# Patient Record
Sex: Female | Born: 1980 | Hispanic: No | Marital: Married | Smoking: Never smoker
Health system: Southern US, Community
[De-identification: ages and names within clinical notes are randomized; demographics above are authoritative.]

## PROBLEM LIST (undated history)

## (undated) DIAGNOSIS — K859 Acute pancreatitis without necrosis or infection, unspecified: Secondary | ICD-10-CM

## (undated) DIAGNOSIS — K8020 Calculus of gallbladder without cholecystitis without obstruction: Secondary | ICD-10-CM

---

## 2010-01-16 ENCOUNTER — Emergency Department (HOSPITAL_COMMUNITY): Admission: EM | Admit: 2010-01-16 | Discharge: 2010-01-17 | Payer: Self-pay | Admitting: Emergency Medicine

## 2010-02-12 ENCOUNTER — Emergency Department (HOSPITAL_COMMUNITY): Admission: EM | Admit: 2010-02-12 | Discharge: 2010-02-12 | Payer: Self-pay | Admitting: Emergency Medicine

## 2010-09-21 LAB — POCT URINALYSIS DIPSTICK
Glucose, UA: NEGATIVE mg/dL
Nitrite: NEGATIVE
Protein, ur: NEGATIVE mg/dL
Specific Gravity, Urine: 1.025 (ref 1.005–1.030)
Urobilinogen, UA: 0.2 mg/dL (ref 0.0–1.0)
pH: 5.5 (ref 5.0–8.0)

## 2010-09-21 LAB — URINE CULTURE
Colony Count: NO GROWTH
Culture: NO GROWTH

## 2010-09-23 LAB — CBC
HCT: 38.4 % (ref 36.0–46.0)
MCH: 29.9 pg (ref 26.0–34.0)
MCV: 88.6 fL (ref 78.0–100.0)
Platelets: 168 10*3/uL (ref 150–400)
RDW: 13 % (ref 11.5–15.5)

## 2010-09-23 LAB — WET PREP, GENITAL: Yeast Wet Prep HPF POC: NONE SEEN

## 2010-09-23 LAB — POCT PREGNANCY, URINE: Preg Test, Ur: NEGATIVE

## 2010-09-23 LAB — URINALYSIS, ROUTINE W REFLEX MICROSCOPIC
Bilirubin Urine: NEGATIVE
Glucose, UA: NEGATIVE mg/dL
Hgb urine dipstick: NEGATIVE
Ketones, ur: NEGATIVE mg/dL
Nitrite: NEGATIVE
Protein, ur: NEGATIVE mg/dL
pH: 5.5 (ref 5.0–8.0)

## 2010-09-23 LAB — BASIC METABOLIC PANEL
BUN: 9 mg/dL (ref 6–23)
Creatinine, Ser: 0.6 mg/dL (ref 0.4–1.2)
GFR calc Af Amer: >60 mL/min (ref >60)
Potassium: 3.7 mEq/L (ref 3.5–5.1)

## 2010-09-23 LAB — GC/CHLAMYDIA PROBE AMP, GENITAL: GC Probe Amp, Genital: NEGATIVE

## 2010-09-23 LAB — URINE MICROSCOPIC-ADD ON

## 2010-09-23 LAB — DIFFERENTIAL
Basophils Absolute: 0.1 10*3/uL (ref 0.0–0.1)
Basophils Relative: 1 % (ref 0–1)
Eosinophils Relative: 2 % (ref 0–5)
Lymphocytes Relative: 32 % (ref 12–46)
Neutro Abs: 6.1 10*3/uL (ref 1.7–7.7)

## 2010-09-25 ENCOUNTER — Inpatient Hospital Stay (INDEPENDENT_AMBULATORY_CARE_PROVIDER_SITE_OTHER)
Admission: RE | Admit: 2010-09-25 | Discharge: 2010-09-25 | Disposition: A | Discharge status: Home / Self Care | Payer: Self-pay | Source: Ambulatory Visit | Attending: Family Medicine | Admitting: Family Medicine

## 2010-09-25 DIAGNOSIS — H1045 Other chronic allergic conjunctivitis: Secondary | ICD-10-CM

## 2011-01-23 ENCOUNTER — Other Ambulatory Visit: Payer: Self-pay | Admitting: Infectious Diseases

## 2011-01-23 ENCOUNTER — Ambulatory Visit
Admission: RE | Admit: 2011-01-23 | Discharge: 2011-01-23 | Disposition: A | Discharge status: Home / Self Care | Payer: No Typology Code available for payment source | Source: Ambulatory Visit | Attending: Infectious Diseases | Admitting: Infectious Diseases

## 2011-01-23 DIAGNOSIS — R7611 Nonspecific reaction to tuberculin skin test without active tuberculosis: Secondary | ICD-10-CM

## 2011-08-07 ENCOUNTER — Emergency Department (HOSPITAL_COMMUNITY)
Admission: EM | Admit: 2011-08-07 | Discharge: 2011-08-07 | Discharge status: Against Medical Advice | Payer: Self-pay | Attending: Emergency Medicine | Admitting: Emergency Medicine

## 2011-08-07 DIAGNOSIS — Z0389 Encounter for observation for other suspected diseases and conditions ruled out: Secondary | ICD-10-CM | POA: Insufficient documentation

## 2011-09-19 ENCOUNTER — Emergency Department (HOSPITAL_COMMUNITY): Payer: Medicaid Other

## 2011-09-19 ENCOUNTER — Encounter (HOSPITAL_COMMUNITY): Payer: Self-pay | Admitting: Radiology

## 2011-09-19 ENCOUNTER — Other Ambulatory Visit: Payer: Self-pay

## 2011-09-19 ENCOUNTER — Inpatient Hospital Stay (HOSPITAL_COMMUNITY)
Admission: EM | Admit: 2011-09-19 | Discharge: 2011-09-25 | DRG: 917 | Disposition: A | Discharge status: Other Acute Inpatient Hospital | Payer: Medicaid Other | Attending: Emergency Medicine | Admitting: Emergency Medicine

## 2011-09-19 DIAGNOSIS — F05 Delirium due to known physiological condition: Secondary | ICD-10-CM | POA: Diagnosis present

## 2011-09-19 DIAGNOSIS — F4320 Adjustment disorder, unspecified: Secondary | ICD-10-CM | POA: Diagnosis present

## 2011-09-19 DIAGNOSIS — F1910 Other psychoactive substance abuse, uncomplicated: Secondary | ICD-10-CM | POA: Diagnosis present

## 2011-09-19 DIAGNOSIS — E876 Hypokalemia: Secondary | ICD-10-CM | POA: Diagnosis present

## 2011-09-19 DIAGNOSIS — T50901A Poisoning by unspecified drugs, medicaments and biological substances, accidental (unintentional), initial encounter: Principal | ICD-10-CM | POA: Diagnosis present

## 2011-09-19 DIAGNOSIS — D72829 Elevated white blood cell count, unspecified: Secondary | ICD-10-CM | POA: Diagnosis present

## 2011-09-19 DIAGNOSIS — K209 Esophagitis, unspecified without bleeding: Secondary | ICD-10-CM | POA: Diagnosis present

## 2011-09-19 DIAGNOSIS — J9600 Acute respiratory failure, unspecified whether with hypoxia or hypercapnia: Secondary | ICD-10-CM | POA: Diagnosis present

## 2011-09-19 DIAGNOSIS — G92 Toxic encephalopathy: Secondary | ICD-10-CM | POA: Diagnosis present

## 2011-09-19 DIAGNOSIS — I498 Other specified cardiac arrhythmias: Secondary | ICD-10-CM | POA: Diagnosis present

## 2011-09-19 DIAGNOSIS — R1110 Vomiting, unspecified: Secondary | ICD-10-CM

## 2011-09-19 DIAGNOSIS — G929 Unspecified toxic encephalopathy: Secondary | ICD-10-CM | POA: Diagnosis present

## 2011-09-19 DIAGNOSIS — IMO0002 Reserved for concepts with insufficient information to code with codable children: Secondary | ICD-10-CM | POA: Diagnosis present

## 2011-09-19 DIAGNOSIS — T50902A Poisoning by unspecified drugs, medicaments and biological substances, intentional self-harm, initial encounter: Secondary | ICD-10-CM | POA: Diagnosis present

## 2011-09-19 DIAGNOSIS — T5491XA Toxic effect of unspecified corrosive substance, accidental (unintentional), initial encounter: Secondary | ICD-10-CM | POA: Diagnosis present

## 2011-09-19 LAB — CBC
HCT: 38.2 % (ref 36.0–46.0)
MCHC: 34.6 g/dL (ref 30.0–36.0)
MCV: 84.5 fL (ref 78.0–100.0)
Platelets: 161 10*3/uL (ref 150–400)
RDW: 12.7 % (ref 11.5–15.5)

## 2011-09-19 LAB — POCT I-STAT, CHEM 8
Chloride: 106 mEq/L (ref 96–112)
Creatinine, Ser: 0.6 mg/dL (ref 0.50–1.10)
Glucose, Bld: 92 mg/dL (ref 70–99)
HCT: 42 % (ref 36.0–46.0)
Potassium: 3.2 mEq/L — ABNORMAL LOW (ref 3.5–5.1)

## 2011-09-19 LAB — RAPID URINE DRUG SCREEN, HOSP PERFORMED
Amphetamines: NOT DETECTED
Benzodiazepines: NOT DETECTED
Cocaine: NOT DETECTED
Opiates: NOT DETECTED

## 2011-09-19 LAB — ETHANOL: Alcohol, Ethyl (B): <11 mg/dL (ref 0–11)

## 2011-09-19 LAB — DIFFERENTIAL
Basophils Absolute: 0 10*3/uL (ref 0.0–0.1)
Basophils Relative: 0 % (ref 0–1)
Eosinophils Relative: 0 % (ref 0–5)
Monocytes Absolute: 0.7 10*3/uL (ref 0.1–1.0)
Neutro Abs: 9.6 10*3/uL — ABNORMAL HIGH (ref 1.7–7.7)

## 2011-09-19 LAB — COMPREHENSIVE METABOLIC PANEL
AST: 19 U/L (ref 0–37)
Albumin: 3.9 g/dL (ref 3.5–5.2)
Calcium: 9 mg/dL (ref 8.4–10.5)
Creatinine, Ser: 0.62 mg/dL (ref 0.50–1.10)
Total Protein: 7.2 g/dL (ref 6.0–8.3)

## 2011-09-19 LAB — SALICYLATE LEVEL: Salicylate Lvl: <2 mg/dL — ABNORMAL LOW (ref 2.8–20.0)

## 2011-09-19 MED ORDER — SODIUM CHLORIDE 0.9 % IV BOLUS (SEPSIS)
1000.0000 mL | Freq: Once | INTRAVENOUS | Status: AC
  Administered 2011-09-19: 1000 mL via INTRAVENOUS

## 2011-09-19 MED ORDER — ONDANSETRON HCL 4 MG/2ML IJ SOLN
4.0000 mg | Freq: Once | INTRAMUSCULAR | Status: AC
  Administered 2011-09-19: 4 mg via INTRAVENOUS

## 2011-09-19 MED ORDER — NALOXONE HCL 0.4 MG/ML IJ SOLN
INTRAMUSCULAR | Status: AC
  Administered 2011-09-19: 23:00:00
  Filled 2011-09-19: qty 2

## 2011-09-19 MED ORDER — ONDANSETRON HCL 4 MG/2ML IJ SOLN
INTRAMUSCULAR | Status: AC
  Filled 2011-09-19: qty 2

## 2011-09-19 NOTE — ED Notes (Signed)
EKG shown to Dr. Jeannetta Nap, copies in the chart

## 2011-09-19 NOTE — ED Notes (Signed)
PT keeps moving head from side to side and repeating "water". Pt answered question from RN. MD at and family at bedside. Husband reports that pt went into kitchen and drink something that was milky. Husband reports that pt  1 minute later she fell down and began vomiting.

## 2011-09-19 NOTE — ED Provider Notes (Signed)
History     CSN: [id number]  Arrival date & time 09/19/11  2245   First MD Initiated Contact with Patient 09/19/11 2253      No chief complaint on file.   (Consider location/radiation/quality/duration/timing/severity/associated sxs/prior treatment) Patient is a 31 y.o. female presenting with altered mental status. The history is provided by the patient and the spouse. The history is limited by the condition of the patient and a language barrier.  Altered Mental Status This is a new problem. The current episode started today. The problem occurs constantly. The problem has been unchanged. Associated symptoms include vomiting. Pertinent negatives include no abdominal pain, chest pain, chills, congestion, fever, headaches or nausea. The symptoms are aggravated by nothing. She has tried nothing for the symptoms.    No past medical history on file.  No past surgical history on file.  No family history on file.  History  Substance Use Topics  . Smoking status: Not on file  . Smokeless tobacco: Not on file  . Alcohol Use: Not on file    OB History    No data available      Review of Systems  Constitutional: Negative for fever and chills.  HENT: Negative for congestion and rhinorrhea.   Respiratory: Negative for shortness of breath.   Cardiovascular: Negative for chest pain and leg swelling.  Gastrointestinal: Positive for vomiting. Negative for nausea, abdominal pain and constipation.  Genitourinary: Negative for urgency, decreased urine volume and difficulty urinating.  Skin: Negative for wound.  Neurological: Negative for headaches.  Psychiatric/Behavioral: Positive for altered mental status.  All other systems reviewed and are negative.    Allergies  Review of patient's allergies indicates not on file.  Home Medications  No current outpatient prescriptions on file.  BP 119/83  Pulse 118  Temp(Src) 100.3 F (37.9 C) (Rectal)  Resp 7  SpO2 100%  Physical Exam    Constitutional: She is oriented to person, place, and time. She appears well-developed and well-nourished. No distress.  HENT:  Head: Normocephalic and atraumatic.    Right Ear: External ear normal.  Left Ear: External ear normal.  Nose: Nose normal.  Mouth/Throat: Oropharynx is clear and moist.  Eyes: Pupils are equal, round, and reactive to light.  Neck: Neck supple.  Cardiovascular: Normal rate, regular rhythm, normal heart sounds and intact distal pulses.   Pulmonary/Chest: Effort normal and breath sounds normal. No respiratory distress. She has no wheezes. She has no rales.  Abdominal: Soft. She exhibits no distension. There is no tenderness.  Musculoskeletal: She exhibits no edema.  Lymphadenopathy:    She has no cervical adenopathy.  Neurological: She is alert and oriented to person, place, and time. GCS eye subscore is 1. GCS verbal subscore is 1. GCS motor subscore is 1.  Skin: Skin is warm and dry. She is not diaphoretic.       ED Course  Procedures (including critical care time)  Labs Reviewed  CBC - Abnormal; Notable for the following:    WBC 12.5 (*)    All other components within normal limits  DIFFERENTIAL - Abnormal; Notable for the following:    Neutro Abs 9.6 (*)    All other components within normal limits  POCT I-STAT, CHEM 8 - Abnormal; Notable for the following:    Potassium 3.2 (*)    All other components within normal limits  PREGNANCY, URINE  URINE RAPID DRUG SCREEN (HOSP PERFORMED)  COMPREHENSIVE METABOLIC PANEL  URINALYSIS, ROUTINE W REFLEX MICROSCOPIC  ETHANOL  ACETAMINOPHEN  LEVEL  SALICYLATE LEVEL  LACTIC ACID, PLASMA   Dg Chest 1 View  09/19/2011  *RADIOLOGY REPORT*  Clinical Data: Unresponsive  CHEST - 1 VIEW  Comparison: None.  Findings: Lungs are clear. No pleural effusion or pneumothorax. The cardiomediastinal contours are within normal limits. The visualized bones and soft tissues are without significant appreciable abnormality.   IMPRESSION: No acute cardiopulmonary process.  Original Report Authenticated By: Waneta Martins, M.D.   Ct Head Wo Contrast  09/19/2011  *RADIOLOGY REPORT*  Clinical Data: Unresponsive  CT HEAD WITHOUT CONTRAST  Technique:  Contiguous axial images were obtained from the base of the skull through the vertex without contrast.  Comparison: None.  Findings: Left greater than right periventricular hypoattenuation. No associated mass effect.  There is no evidence for acute hemorrhage, hydrocephalus, mass lesion, or abnormal extra-axial fluid collection.  No definite CT evidence for acute infarction.  IMPRESSION: Mild periventricular white matter hypoattenuation is nonspecific.  Otherwise, no acute intracranial abnormality.  Original Report Authenticated By: Waneta Martins, M.D.     Date: 09/19/2011  Rate: 101  Rhythm: sinus tachycardia  QRS Axis: normal  Intervals: QT prolonged  ST/T Wave abnormalities: normal  Conduction Disutrbances:none  Narrative Interpretation:   Old EKG Reviewed: none available   1. Vomiting       MDM  31 yo female presents with unresponsiveness and vomiting. Found at home by husband, noted to have vomited on kitchen floor with white substance on her chin. Here patient is GCS of 3, though breathing and with good pulses. Patient's GCS improved with narcan. EKG shows no QRS widening or other significant abnormalities. Patient verbal but only repeating the same thing ("water"). No focal neuro signs otherwise after the narcan. Protecting airway, and stable vitals. Doubt meningitis/encephalitis without fever and resolving AMS. [facility] consulted since patient is awake and alert, though not quite conversing as well as normal. Likely an overdose, will need monitoring with tele.        Pricilla Loveless, MD 09/20/11 [phone]

## 2011-09-19 NOTE — ED Notes (Signed)
Narcan 0.4mg  given. PT began dry heaving and opened eyes briefly.

## 2011-09-19 NOTE — ED Notes (Signed)
Patient transported to CT 

## 2011-09-19 NOTE — ED Notes (Signed)
Pt returned from CT and xray with RN. Pt is moving extreme ties holding face with hand and dry heaving. Suction provided for white colored foam pt is spitting up.

## 2011-09-19 NOTE — ED Notes (Signed)
PT brought to department by "spiritual advisor" foaming at mouth, unresponsive; feet and mouth covered in in white substance.

## 2011-09-19 NOTE — ED Notes (Signed)
Pt CBG was 84

## 2011-09-20 ENCOUNTER — Other Ambulatory Visit: Payer: Self-pay

## 2011-09-20 ENCOUNTER — Inpatient Hospital Stay (HOSPITAL_COMMUNITY): Payer: Medicaid Other

## 2011-09-20 ENCOUNTER — Encounter (HOSPITAL_COMMUNITY)
Admission: EM | Disposition: A | Discharge status: Other Acute Inpatient Hospital | Payer: Self-pay | Source: Home / Self Care | Attending: Emergency Medicine

## 2011-09-20 ENCOUNTER — Encounter (HOSPITAL_COMMUNITY): Payer: Self-pay | Admitting: Internal Medicine

## 2011-09-20 DIAGNOSIS — T5491XA Toxic effect of unspecified corrosive substance, accidental (unintentional), initial encounter: Secondary | ICD-10-CM | POA: Diagnosis present

## 2011-09-20 DIAGNOSIS — T50902A Poisoning by unspecified drugs, medicaments and biological substances, intentional self-harm, initial encounter: Secondary | ICD-10-CM

## 2011-09-20 DIAGNOSIS — F05 Delirium due to known physiological condition: Secondary | ICD-10-CM | POA: Diagnosis present

## 2011-09-20 DIAGNOSIS — T6591XA Toxic effect of unspecified substance, accidental (unintentional), initial encounter: Secondary | ICD-10-CM

## 2011-09-20 DIAGNOSIS — F329 Major depressive disorder, single episode, unspecified: Secondary | ICD-10-CM

## 2011-09-20 DIAGNOSIS — T65891A Toxic effect of other specified substances, accidental (unintentional), initial encounter: Secondary | ICD-10-CM

## 2011-09-20 DIAGNOSIS — R4182 Altered mental status, unspecified: Secondary | ICD-10-CM

## 2011-09-20 DIAGNOSIS — T50901A Poisoning by unspecified drugs, medicaments and biological substances, accidental (unintentional), initial encounter: Principal | ICD-10-CM

## 2011-09-20 DIAGNOSIS — D72829 Elevated white blood cell count, unspecified: Secondary | ICD-10-CM | POA: Diagnosis present

## 2011-09-20 DIAGNOSIS — E876 Hypokalemia: Secondary | ICD-10-CM | POA: Diagnosis present

## 2011-09-20 DIAGNOSIS — J9600 Acute respiratory failure, unspecified whether with hypoxia or hypercapnia: Secondary | ICD-10-CM

## 2011-09-20 DIAGNOSIS — IMO0002 Reserved for concepts with insufficient information to code with codable children: Secondary | ICD-10-CM | POA: Diagnosis present

## 2011-09-20 HISTORY — PX: ESOPHAGOGASTRODUODENOSCOPY: SHX5428

## 2011-09-20 LAB — CARDIAC PANEL(CRET KIN+CKTOT+MB+TROPI)
Relative Index: 1.6 (ref 0.0–2.5)
Total CK: 121 U/L (ref 7–177)

## 2011-09-20 LAB — BLOOD GAS, ARTERIAL
Acid-base deficit: 11.9 mmol/L — ABNORMAL HIGH (ref 0.0–2.0)
Bicarbonate: 16.1 mEq/L — ABNORMAL LOW (ref 20.0–24.0)
Bicarbonate: 12.7 mEq/L — ABNORMAL LOW (ref 20.0–24.0)
Drawn by: 32526
FIO2: 0.21 %
O2 Saturation: 97.9 %
O2 Saturation: 99.9 %
PEEP: 5 cmH2O
Patient temperature: 98.6
TCO2: 13.4 mmol/L (ref 0–100)
pCO2 arterial: 30.7 mmHg — ABNORMAL LOW (ref 35.0–45.0)
pH, Arterial: 7.338 — ABNORMAL LOW (ref 7.350–7.400)
pO2, Arterial: 101 mmHg — ABNORMAL HIGH (ref 80.0–100.0)

## 2011-09-20 LAB — HEPATIC FUNCTION PANEL
AST: 22 U/L (ref 0–37)
Bilirubin, Direct: 0.2 mg/dL (ref 0.0–0.3)
Indirect Bilirubin: 0.8 mg/dL (ref 0.3–0.9)
Total Protein: 6.8 g/dL (ref 6.0–8.3)

## 2011-09-20 LAB — BASIC METABOLIC PANEL
CO2: 15 mEq/L — ABNORMAL LOW (ref 19–32)
Calcium: 8 mg/dL — ABNORMAL LOW (ref 8.4–10.5)
Creatinine, Ser: 0.4 mg/dL — ABNORMAL LOW (ref 0.50–1.10)
GFR calc non Af Amer: >90 mL/min (ref >90)
Sodium: 136 mEq/L (ref 135–145)

## 2011-09-20 LAB — CBC
MCV: 85.6 fL (ref 78.0–100.0)
Platelets: 191 10*3/uL (ref 150–400)
RBC: 4.45 MIL/uL (ref 3.87–5.11)
RDW: 12.9 % (ref 11.5–15.5)
WBC: 19.5 10*3/uL — ABNORMAL HIGH (ref 4.0–10.5)

## 2011-09-20 LAB — ETHANOL: Alcohol, Ethyl (B): <11 mg/dL (ref 0–11)

## 2011-09-20 LAB — GLUCOSE, CAPILLARY: Glucose-Capillary: 84 mg/dL (ref 70–99)

## 2011-09-20 SURGERY — EGD (ESOPHAGOGASTRODUODENOSCOPY)
Anesthesia: Moderate Sedation

## 2011-09-20 MED ORDER — ETOMIDATE 2 MG/ML IV SOLN
20.0000 mg | Freq: Once | INTRAVENOUS | Status: AC
  Administered 2011-09-20: 20 mg via INTRAVENOUS

## 2011-09-20 MED ORDER — HALOPERIDOL LACTATE 5 MG/ML IJ SOLN
0.5000 mg | Freq: Four times a day (QID) | INTRAMUSCULAR | Status: DC
  Administered 2011-09-20: 0.5 mg via INTRAVENOUS
  Filled 2011-09-20 (×6): qty 0.1

## 2011-09-20 MED ORDER — CHLORHEXIDINE GLUCONATE 0.12 % MT SOLN
15.0000 mL | Freq: Two times a day (BID) | OROMUCOSAL | Status: DC
  Administered 2011-09-20 – 2011-09-21 (×4): 15 mL via OROMUCOSAL
  Filled 2011-09-20 (×5): qty 15

## 2011-09-20 MED ORDER — MIDAZOLAM HCL 2 MG/2ML IJ SOLN
4.0000 mg | Freq: Once | INTRAMUSCULAR | Status: AC
  Administered 2011-09-20: 4 mg via INTRAVENOUS

## 2011-09-20 MED ORDER — SODIUM CHLORIDE 0.9 % IV SOLN
INTRAVENOUS | Status: DC
  Administered 2011-09-20: 15:00:00 via INTRAVENOUS
  Administered 2011-09-21: 75 mL via INTRAVENOUS
  Administered 2011-09-22: 06:00:00 via INTRAVENOUS

## 2011-09-20 MED ORDER — WHITE PETROLATUM GEL
Status: AC
  Administered 2011-09-20: 14:00:00
  Filled 2011-09-20: qty 5

## 2011-09-20 MED ORDER — FENTANYL CITRATE 0.05 MG/ML IJ SOLN
INTRAMUSCULAR | Status: AC
  Administered 2011-09-20: 100 ug via INTRAVENOUS
  Filled 2011-09-20: qty 4

## 2011-09-20 MED ORDER — MIDAZOLAM HCL 2 MG/2ML IJ SOLN
2.0000 mg | INTRAMUSCULAR | Status: DC | PRN
  Administered 2011-09-20 – 2011-09-21 (×2): 2 mg via INTRAVENOUS
  Filled 2011-09-20: qty 4
  Filled 2011-09-20 (×2): qty 2

## 2011-09-20 MED ORDER — FENTANYL CITRATE 0.05 MG/ML IJ SOLN
100.0000 ug | Freq: Once | INTRAMUSCULAR | Status: AC
  Administered 2011-09-20: 100 ug via INTRAVENOUS

## 2011-09-20 MED ORDER — PANTOPRAZOLE SODIUM 40 MG IV SOLR
40.0000 mg | Freq: Two times a day (BID) | INTRAVENOUS | Status: DC
  Administered 2011-09-20 – 2011-09-21 (×4): 40 mg via INTRAVENOUS
  Filled 2011-09-20 (×7): qty 40

## 2011-09-20 MED ORDER — MIDAZOLAM HCL 2 MG/2ML IJ SOLN
INTRAMUSCULAR | Status: AC
  Administered 2011-09-20: 4 mg via INTRAVENOUS
  Filled 2011-09-20: qty 4

## 2011-09-20 MED ORDER — SODIUM CHLORIDE 0.9 % IV BOLUS (SEPSIS)
500.0000 mL | Freq: Once | INTRAVENOUS | Status: AC
  Administered 2011-09-20: 500 mL via INTRAVENOUS

## 2011-09-20 MED ORDER — FENTANYL CITRATE 0.05 MG/ML IJ SOLN
50.0000 ug | INTRAMUSCULAR | Status: DC | PRN
  Administered 2011-09-20: 25 ug via INTRAVENOUS
  Administered 2011-09-20 – 2011-09-21 (×2): 100 ug via INTRAVENOUS
  Filled 2011-09-20: qty 4
  Filled 2011-09-20 (×2): qty 2

## 2011-09-20 MED ORDER — SODIUM CHLORIDE 0.9 % IV SOLN
2.0000 g | Freq: Once | INTRAVENOUS | Status: AC
  Administered 2011-09-20: 2 g via INTRAVENOUS
  Filled 2011-09-20: qty 2

## 2011-09-20 MED ORDER — POTASSIUM CHLORIDE CRYS ER 20 MEQ PO TBCR
40.0000 meq | EXTENDED_RELEASE_TABLET | Freq: Once | ORAL | Status: AC
  Administered 2011-09-20: 40 meq via ORAL
  Filled 2011-09-20: qty 2

## 2011-09-20 MED ORDER — HALOPERIDOL LACTATE 5 MG/ML IJ SOLN: 1.0000 mg | Freq: Four times a day (QID) | INTRAMUSCULAR | Status: DC

## 2011-09-20 MED ORDER — SODIUM CHLORIDE 0.9 % IV SOLN
INTRAVENOUS | Status: DC
  Filled 2011-09-20: qty 500

## 2011-09-20 MED ORDER — SODIUM CHLORIDE 0.9 % IV SOLN
INTRAVENOUS | Status: AC
  Administered 2011-09-20: 19:00:00 via INTRAVENOUS

## 2011-09-20 NOTE — Progress Notes (Signed)
Called by bedside RN to assist with transport to ICU, patient is more lethargic and less responsive. Attending MD and critical care NP at patient's bedside. Upon admission to the ICU, patient intubated for airway protection, VSS throughout procedure.

## 2011-09-20 NOTE — Progress Notes (Addendum)
31 yo [place] woman, does not speak english, no known PMH who attempted suicide last PM per husband. Went into kitchen drank an unknown white milky/powdery substance, fell to floor vomiting, unresponsive around 9PM. History very unclear from ED records, I have attempted to contact husband unsuccessfully this AM. She is currently unresponsive, her lower extremities are flaccid, she is foaming at the mouth, lots of saliva, pinpoint pupils, skin very warm to touch, facial muscles are twitching, nystagmus. Records are unclear but there is spiritual advisor and husband involved in her care, she also has young children.   Assessment: 31 yo with unknown overdose suicide attempt. Very concerned for life-threatening overdose, she is clearly deteriorating-I contacted poison control for additional guidance. The powdery substance is at the patients bedside. I provided history and clinical information in detail. Toxicologist concerned for pesticide poisoning vs detergents vs. Alkali. No acidosis on BMET, but need an ABG to verify this, gap is 15. Substance is at bedside in a bucket. My primary concern is for subclinical seizure, unresponsive, muscle twitching. Her increased body temp, elevated WBC, pinpoint pupils, AMS suggest this as strong possibility. Prelim tox for opiates etc.. Negative.  I contacted Poison control for guidance -they are actively following this case-toxicologist involved  [phone] primary contact Rita Ohara, Toxicologist is Dr. Kathryne Eriksson.  1. Needs urgent GI consult for endoscopy? GI burns possible 2. CCM called for ICU transfer, intubation to protect airway, supportive care until further data and history can be obtained. 3. Checking a cholinesterase level for organophosphate poisoning 4. May need meds for subclinical seizure-ordered EEG 5. Place [facility], [facility] wants to know color and if urine has odor. 6. Stat ABG and EKG ordered.  Poison control would like for more information to be  obtained from members in the household about possible substances, other medications in house. Also recommended calling Lab to see if they have toxicologist available for anylsis of substance, they also suggested getting police involved for forensics regarding the substance/foul play/home evaluation.  Time Spent: 70 minutes at bedside and in consultation with [facility].  Anderson Malta, DO Internal Medicine

## 2011-09-20 NOTE — Consult Note (Signed)
  Tiffany Johns is a 31 y.o. female [id number] 1980/07/09  Subjective/Objective: Patient is 31 year old [place] female who was admitted on a medical floor status post taking unknown amount of poison. Patient is currently intubated. I spoke to husband and her brother, brother endorse that patient was very upset with her husband and apparently they have argument 2 days ago. She was not talking to him and yesterday when patient's husband came from work she ingested unknown amount of poison. Brother told that husband mother is very sick and she was getting pressure from her husband to take care of her. However they been married for past 13 years happily. Couple have 2 children. They came to the [place] in 2010. Patient does not work.  Past psychiatric history As per her brother and husband patient has no significant history of psychiatric illness her suicidal attempt.  mental status examination Patient is intubated. She responded to this Clinical research associate but blinking eyes but did not participate and complete mental status examination.   Filed Vitals:   09/20/11 1500  BP: 103/71  Pulse: 116  Temp:   Resp: 20    Lab Results:   BMET    Component Value Date/Time   NA 136 09/20/2011 1153   K 3.8 09/20/2011 1153   CL 104 09/20/2011 1153   CO2 15* 09/20/2011 1153   GLUCOSE 96 09/20/2011 1153   BUN 8 09/20/2011 1153   CREATININE 0.40* 09/20/2011 1153   CALCIUM 8.0* 09/20/2011 1153   GFRNONAA >90 09/20/2011 1153   GFRAA >90 09/20/2011 1153    Medications:  Scheduled:     . chlorhexidine  15 mL Mouth/Throat BID  . etomidate  20 mg Intravenous Once  . fentaNYL  100 mcg Intravenous Once  . midazolam  4 mg Intravenous Once  . naloxone      . ondansetron      . ondansetron (ZOFRAN) IV  4 mg Intravenous Once  . pantoprazole (PROTONIX) IV  40 mg Intravenous Q12H  . potassium chloride  40 mEq Oral Once  . sodium chloride  1,000 mL Intravenous Once  . sodium chloride  500 mL Intravenous Once  . white petrolatum       . DISCONTD: haloperidol lactate  0.5 mg Intravenous Q6H  . DISCONTD: haloperidol lactate  1 mg Intravenous Q6H     PRN Meds fentaNYL, midazolam   assessment Depressive disorder with suicidal attempt  Plan Please call to reevaluate her again when she is extubated and able to talk. At this time complete assessment cannot be done due to intubation. She will require setter when she is conscious.

## 2011-09-20 NOTE — Consult Note (Signed)
[facility] Consult: 11:25 AM 09/20/2011   Referring Provider:  Dr Delton Coombes from [facility]  Primary Care Physician:  No primary provider on file. Primary Gastroenterologist:  None, unassigned.  Reason for Consultation:  Needs EGD to assess esophagus following chemical ingestion  HPI: Tiffany Johns is a 31 y.o. female. She hails from [place], does not speak [place].  Attempted suicide last PM by swallowing somesort of whit powdery liquid.  Brought to ED by "spiritual advisor", was foaming at mouth.  Intubated this AM secondary to AMS and transferred to ICU.  Poison control concerned for injestion of organophosphate, suggest EGD to assess for esophageal chemical injury.   Labs show hypokalemia, LFTs normal Pt unable to provide consent.  Husband can provide consent.    History reviewed. No pertinent past medical history.  History reviewed. No pertinent past surgical history.  Prior to Admission medications   Not on File    Scheduled Meds:    . etomidate  20 mg Intravenous Once  . fentaNYL  100 mcg Intravenous Once  . haloperidol lactate  0.5 mg Intravenous Q6H  . midazolam  4 mg Intravenous Once  . naloxone      . ondansetron      . ondansetron (ZOFRAN) IV  4 mg Intravenous Once  . pantoprazole (PROTONIX) IV  40 mg Intravenous Q12H  . potassium chloride  40 mEq Oral Once  . sodium chloride  1,000 mL Intravenous Once  . sodium chloride  500 mL Intravenous Once  . DISCONTD: haloperidol lactate  1 mg Intravenous Q6H   Infusions:   PRN Meds: fentaNYL, midazolam   Allergies as of 09/19/2011  . (Not on File)    History reviewed. No pertinent family history.  History   Social History  . Marital Status: Married    Spouse Name: N/A    Number of Children: N/A  . Years of Education: N/A   Occupational History  . Not on file.   Social History Main Topics  . Smoking status: Not on file  . Smokeless tobacco: Not on file  . Alcohol Use: Not on  file  . Drug Use: Not on file  . Sexually Active: Not on file   Other Topics Concern  . Not on file   Social History Narrative  . No narrative on file    REVIEW OF SYSTEMS: Not able to perform.  PHYSICAL EXAM: Vital signs in last 24 hours: Temp:  [97 F (36.1 C)-100.3 F (37.9 C)] 98.6 F (37 C) (03/15 0958) Pulse Rate:  [91-149] 91  (03/15 1100) Resp:  [7-32] 16  (03/15 1100) BP: (77-137)/(46-91) 89/60 mmHg (03/15 1100) SpO2:  [94 %-100 %] 95 % (03/15 1100) FiO2 (%):  [80 %] 80 % (03/15 1100) Weight:  [110 lb 14.3 oz (50.3 kg)] 110 lb 14.3 oz (50.3 kg) (03/15 0215)  General: Thin, not cachectic female.   Head:  Normocephalic, atraumatic  Eyes:  No icterus, no pallor Ears:  unable to assess hearing  Nose:  No discharge.  Mouth:  Intabated.  Mouth exam limited but no ulcers, burns, erythema on the tongue or lips as visualized Neck:  No mass, no JVD Lungs:  Clear B.  Unlabored breathing Heart: RRR.  No MRG Abdomen:  Soft, thin, NT, ND.  Low mid-line scar (? c-section vs hysto?).  Striae.   Rectal: not done   Musc/Skeltl: no joint swelling or deformity Extremities:  No pedal edema.  Feet warm.    Neurologic:  Unresponsive, sedated.  Skin:  No rash, bruises or sores Tattoos:  none Nodes:  None at neck or groin   Psych:  Unable to assess.   Intake/Output from previous day: 03/14 0701 - 03/15 0700 In: 60 [P.O.:60] Out: -  Intake/Output this shift:    LAB RESULTS:  Basename 09/19/11 2309 09/19/11 2252  WBC -- 12.5*  HGB 14.3 13.2  HCT 42.0 38.2  PLT -- 161   BMET Lab Results  Component Value Date   NA 142 09/19/2011   NA 138 09/19/2011   K 3.2* 09/19/2011   K 3.0* 09/19/2011   CL 106 09/19/2011   CL 102 09/19/2011   CO2 21 09/19/2011   GLUCOSE 92 09/19/2011   GLUCOSE 96 09/19/2011   BUN 17 09/19/2011   BUN 16 09/19/2011   CREATININE 0.60 09/19/2011   CREATININE 0.62 09/19/2011   CALCIUM 9.0 09/19/2011   LFT  Basename 09/19/11 2252  PROT 7.2  ALBUMIN 3.9    AST 19  ALT 15  ALKPHOS 54  BILITOT 1.1  BILIDIR --  IBILI --   PT/INR No results found for this basename: INR, PROTIME   Hepatitis Panel No results found for this basename: HEPBSAG,HCVAB,HEPAIGM,HEPBIGM in the last 72 hours C-Diff No components found with this basename: cdiff    Drugs of Abuse     Component Value Date/Time   LABOPIA NONE DETECTED 09/19/2011 2256   COCAINSCRNUR NONE DETECTED 09/19/2011 2256   LABBENZ NONE DETECTED 09/19/2011 2256   AMPHETMU NONE DETECTED 09/19/2011 2256   THCU NONE DETECTED 09/19/2011 2256   LABBARB NONE DETECTED 09/19/2011 2256     RADIOLOGY STUDIES: Dg Chest 1 View  09/19/2011  *RADIOLOGY REPORT*  Clinical Data: Unresponsive  CHEST - 1 VIEW  Comparison: None.  Findings: Lungs are clear. No pleural effusion or pneumothorax. The cardiomediastinal contours are within normal limits. The visualized bones and soft tissues are without significant appreciable abnormality.  IMPRESSION: No acute cardiopulmonary process.  Original Report Authenticated By: Waneta Martins, M.D.   Ct Head Wo Contrast  09/19/2011  *RADIOLOGY REPORT*  Clinical Data: Unresponsive  CT HEAD WITHOUT CONTRAST  Technique:  Contiguous axial images were obtained from the base of the skull through the vertex without contrast.  Comparison: None.  Findings: Left greater than right periventricular hypoattenuation. No associated mass effect.  There is no evidence for acute hemorrhage, hydrocephalus, mass lesion, or abnormal extra-axial fluid collection.  No definite CT evidence for acute infarction.  IMPRESSION: Mild periventricular white matter hypoattenuation is nonspecific.  Otherwise, no acute intracranial abnormality.  Original Report Authenticated By: Waneta Martins, M.D.    ENDOSCOPIC STUDIES: None   IMPRESSION: 1.  Poison injestion, possibly organophosphate.  Needs EGD to r/o esohageal injury.  Husband has given verbal consent for EGD  PLAN: 2.  EGD today.   LOS: 1 day    Jennye Moccasin  09/20/2011, 11:25 AM Pager: [phone]

## 2011-09-20 NOTE — Progress Notes (Signed)
EEG completed, portable w/ video.

## 2011-09-20 NOTE — Consult Note (Signed)
Name: Tiffany Johns MRN: [id number] DOB: [birth year]    LOS: 1  [facility] Critical [facility] Note   History of Present Illness:  31 y/o [place] female presented to [facility] ED on 3/15 after ingesting an unknown white substance at home and subsequently developing AMS.  Admitted to medical floor.  PCCM called am of 3/15 for AMS and concern for airway protection.  Tx to ICU for intubation in setting of depressed mental status.    Lines / Drains: 3/15 OETT>>>  Cultures / Micro: 3/15 UA>>> 3/15 UDS>>> 3/15 lactate>>>  Antibiotics:  Tests / Events: 3/15 CT HEAD>>>Mild periventricular white matter hypoattenuation is nonspecific. Otherwise, no acute intracranial abnormality.   History reviewed. No pertinent past medical history.  History reviewed. No pertinent past surgical history.  Prior to Admission medications   Not on File    Allergies Not on File  Family History History reviewed. No pertinent family history.  Social History  does not have a smoking history on file. She does not have any smokeless tobacco history on file. Her alcohol and drug histories not on file.  Review Of Systems: unable to complete  Vital Signs: Filed Vitals:   09/20/11 1045 09/20/11 1048 09/20/11 1050 09/20/11 1100  BP: 77/46 95/62 93/64  89/60  Pulse: 109 95 102 91  Temp:      TempSrc:      Resp: 21 23 23 16   Weight:      SpO2: 98% 94% 95% 95%    I/O last 3 completed shifts: In: 60 [P.O.:60] Out: -   Physical Examination: General: wdwn adult female with significantly altered mental status Neuro: minimal response to pain, pinpoint pupils, +gag CV: s1s2 tachy, regular PULM: resp's shallow, snoreous, lungs bilaterally clear GI: flat, soft, bsx4 active Extremities: warm/,dry, no edema   Ventilator settings: Vent Mode:  [-] PRVC FiO2 (%):  [80 %] 80 % Set Rate:  [16 bmp] 16 bmp Vt Set:  [450 mL] 450 mL PEEP:  [5 cmH20] 5 cmH20 Plateau Pressure:  [15 cmH20] 15 cmH20  Labs     CBC  Lab 09/19/11 2309 09/19/11 2252  HGB 14.3 13.2  HCT 42.0 38.2  WBC -- 12.5*  PLT -- 161    BMET  Lab 09/19/11 2309 09/19/11 2252  NA 142 138  K 3.2* 3.0*  CL 106 102  CO2 -- 21  GLUCOSE 92 96  BUN 17 16  CREATININE 0.60 0.62  CALCIUM -- 9.0  MG -- --  PHOS -- --     Lab 09/20/11 1025 09/19/11 2309  PHART 7.338* --  PCO2ART 30.7* --  PO2ART 101.0* --  HCO3 16.1* --  TCO2 17.0 21  O2SAT 97.9 --     Radiology:    Assessment and Plan:  AMS in setting of reported ingestion of unknown white substance Assessment: CT head negative.  Reported ingestion of glass of white material from bucket under sink.  Patient with progressive AMS.  Unable to protect airway.  Plan: -Poison Control Notified -UDS, tricyclics  Acute Respiratory Failure Assessment: in setting of above Plan: -full vent support -Intermittent sedation -CXR now and in am   Concern for GI burns / potential for perforation Assessment: in setting of ingestion of unknown agent Plan: -GI consult for EGD recommended by Poison Control to r/o burns -defer Rx to GI -PPI -check LFT's  Sinus Tachycardia Assessment: prolonged QTc noted on EKG. Received haldol Plan: -serial EKG's daily -avoid medications that prolong QTc   Hypokalemia Assessment: Plan: -replete  PRN -f/u BMP   Leucocytosis Assessment: ? If stress response Plan: -monitor -hold abx for now as no suspicion of infectious process   Best practices / Disposition: -->Code Status:  Full code -->DVT Px: SCD's until after scope, then consider SQ heparin -->GI Px: protonix -->Diet: NPO   Canary Brim, NP-C [facility] Pgr: [phone]  09/20/2011, 11:18 AM   Attending Addendum:  I have seen the patient, discussed the issues, test results and plans with B. Veleta Miners, NP. I agree with the Assessment and Plans as ammended above.   Levy Pupa, MD, PhD 09/20/2011, 11:27 AM [facility] [phone] or if no answer [phone]

## 2011-09-20 NOTE — Progress Notes (Signed)
ETT pulled back per order to 17cm at teeth.  Pt tolerated well rt to monitor SPO2 100%.

## 2011-09-20 NOTE — Consult Note (Signed)
EEG = normal awake, drowsy, sleep and sedated EEG.   Carmell Austria, MD

## 2011-09-20 NOTE — Progress Notes (Signed)
Contacted by Poison control toxicologist- she has review data, advises giving 2-PAM for organophosphate Poisoning/pesticide dose is Continuous infusion 500mg /hr of 2.5% solution, toxicologist available for guidance. I spoke withBrandi Veleta Miners, NP with CCM to discuss recommendation.

## 2011-09-20 NOTE — ED Notes (Signed)
AC updated on pt, notified of need for sitter & orders.

## 2011-09-20 NOTE — Procedures (Addendum)
Intubation Procedure Note Tiffany Johns [id number] 16-Dec-1980  Procedure: Intubation Indications: Airway protection and maintenance  Procedure Details Consent: Unable to obtain consent because of altered level of consciousness. Time Out: Verified patient identification, verified procedure, site/side was marked, verified correct patient position, special equipment/implants available, medications/allergies/relevent history reviewed, required imaging and test results available.  Performed  Maximum sterile technique was used including gloves, gown, hand hygiene and mask.  MAC and 3    Evaluation Hemodynamic Status: Transient hypotension resolved spontaneously and treated with fluid; O2 sats: stable throughout Patient's Current Condition: stable Complications: No apparent complications Patient did tolerate procedure well. Chest X-ray ordered to verify placement.  CXR: pending.   Canary Brim, NP-C [facility] Pgr: [phone]   Levy Pupa, MD, PhD 09/20/2011, 11:28 AM [facility] [phone] or if no answer [phone]

## 2011-09-20 NOTE — Progress Notes (Signed)
INITIAL ADULT NUTRITION ASSESSMENT Date: 09/20/2011   Time: 3:11 PM  Reason for Assessment: VDRF  ASSESSMENT: Female 31 y.o.  Dx: Delirium due to conditions classified elsewhere  Hx: History reviewed. No pertinent past medical history.  Related Meds:     . chlorhexidine  15 mL Mouth/Throat BID  . etomidate  20 mg Intravenous Once  . fentaNYL  100 mcg Intravenous Once  . midazolam  4 mg Intravenous Once  . naloxone      . ondansetron      . ondansetron (ZOFRAN) IV  4 mg Intravenous Once  . pantoprazole (PROTONIX) IV  40 mg Intravenous Q12H  . potassium chloride  40 mEq Oral Once  . sodium chloride  1,000 mL Intravenous Once  . sodium chloride  500 mL Intravenous Once  . white petrolatum      . DISCONTD: haloperidol lactate  0.5 mg Intravenous Q6H  . DISCONTD: haloperidol lactate  1 mg Intravenous Q6H   Ht:  4\' 11"  (150 cm)  Wt: 110 lb 14.3 oz (50.3 kg)  Ideal Wt:    43.2 kg  % Ideal Wt: 116%  Usual Wt: unable to obtain % Usual Wt: n/a  BMI: 22.4 (weight is WNL)  Food/Nutrition Related Hx: unable to obtain at this time  Labs:  CMP     Component Value Date/Time   NA 136 09/20/2011 1153   K 3.8 09/20/2011 1153   CL 104 09/20/2011 1153   CO2 15* 09/20/2011 1153   GLUCOSE 96 09/20/2011 1153   BUN 8 09/20/2011 1153   CREATININE 0.40* 09/20/2011 1153   CALCIUM 8.0* 09/20/2011 1153   PROT 6.8 09/20/2011 1153   ALBUMIN 3.5 09/20/2011 1153   AST 22 09/20/2011 1153   ALT 13 09/20/2011 1153   ALKPHOS 52 09/20/2011 1153   BILITOT 1.0 09/20/2011 1153   GFRNONAA >90 09/20/2011 1153   GFRAA >90 09/20/2011 1153   Intake/Output: I/O last 3 completed shifts: In: 60 [P.O.:60] Out: -  Total I/O In: -  Out: 765 [Urine:765]  Diet Order:   NPO  Supplements/Tube Feeding: None  IVF:    sodium chloride Last Rate: 75 mL/hr at 09/20/11 1456   Estimated Nutritional Needs:   Kcal: 1270 kcal Protein: 70 - 85 grams Fluid:  1.5 - 1.7 L/d  Pt is 31 yo [name] who was admitted  after consumption of "white powdered substance." Per MD notes, intake was suicide attempt. Poison control following. Intubated on 3/15. Poison control concerned for ingestion of organophosphate, suggest EGD to assess for esophageal chemical injury.  Endoscopy completed 3/15, findings of mild esophagitis with recommendations of clear liquid diet once able to take PO's.  NUTRITION DIAGNOSIS: -Inadequate oral intake (NI-2.1).  Status: Ongoing  RELATED TO: inability to eat  AS EVIDENCE BY: NPO status  MONITORING/EVALUATION(Goals): Goal: If unable to extubate within 24 - 48 hours, recommend initiation of nutrition support. Monitor: extubation, weights, labs, plan of care  EDUCATION NEEDS: -No education needs identified at this time  INTERVENTION: 1. If unable to extubate within 24 - 48 hours, recommend initiation of nutrition support of Osmolite 1.2 via enteral feeding tube at 20 ml/hr, advance by 10 ml/hr every 4 hours, to goal of 40 ml/hr. 30 ml Prostat liquid protein via tube BID. Total TF regimen will provide: 1296 kcal, 84 grams protein, 787 ml free water. 2. RD to follow nutrition care plan  Dietitian #: [phone]  DOCUMENTATION CODES Per approved criteria  -Not Applicable   Adair Laundry  09/20/2011, 3:11 PM

## 2011-09-20 NOTE — Progress Notes (Signed)
MEDICATION RELATED CONSULT NOTE - INITIAL   Pharmacy Consult: Pralidoxime (2-PAM) Indication: possible organophosphate poisoning  Not on File  Patient Measurements: Weight: 110 lb 14.3 oz (50.3 kg)  Vital Signs: Temp: 98.6 F (37 C) (03/15 1100) Temp src: Oral (03/15 1100) BP: 103/71 mmHg (03/15 1500) Pulse Rate: 116  (03/15 1500) Intake/Output from previous day: 03/14 0701 - 03/15 0700 In: 60 [P.O.:60] Out: -  Intake/Output from this shift: Total I/O In: 5 [I.V.:5] Out: 1025 [Urine:1025]  Labs:  [facility] 09/20/11 1153 09/19/11 2309 09/19/11 2252  WBC 19.5* -- 12.5*  HGB 13.1 14.3 13.2  HCT 38.1 42.0 38.2  PLT 191 -- 161  APTT -- -- --  CREATININE 0.40* 0.60 0.62  LABCREA -- -- --  CREATININE 0.40* 0.60 0.62  CREAT24HRUR -- -- --  MG -- -- --  PHOS -- -- --  ALBUMIN 3.5 -- 3.9  PROT 6.8 -- 7.2  ALBUMIN 3.5 -- 3.9  AST 22 -- 19  ALT 13 -- 15  ALKPHOS 52 -- 54  BILITOT 1.0 -- 1.1  BILIDIR 0.2 -- --  IBILI 0.8 -- --   CrCl is unknown because there is no height on file for the current visit.   Microbiology: Recent Results (from the past 720 hour(s))  MRSA PCR SCREENING     Status: Normal   Collection Time   09/20/11 12:08 PM      Component Value Range Status Comment   MRSA by PCR NEGATIVE  NEGATIVE  Final     Medical History: History reviewed. No pertinent past medical history.   Assessment: 30 YOF admitted with AMS s/p ingesting a white powdered substance.  Concerned with possible organophosphate poisoning and pharmacy asked to dose pralidoxime x 24 hours.  Noted EGD showed some hemorrhagic gastritis.  Spoke to MaryAnne/Joyce at [facility] regarding dosing, preparation, and length of therapy.   Plan:  - Pralidoxime 2gm IV in NS over 1 hour, then - Pralidoxime gtt at 500 mg/hr x 24 hours - Reassess for s/sx of organophosphate poisoning (muscle weakness/twitching, vomiting, seizure, lung/oral secretions, excessive salivation, pulmonary edema) and d/c  med as appropriate - [facility] recommended pralidoxime infusion x 24 hours, up to 48 hours. - F/U [facility] / MD tomorrow    Phillips Climes, PharmD 09/20/2011,4:04 PM

## 2011-09-20 NOTE — Procedures (Signed)
Intubation Procedure Note Tiffany Johns [id number] January 16, 1981  Procedure: Intubation Indications: Respiratory insufficiency  Procedure Details Consent: Unable to obtain consent because of emergent medical necessity. Time Out: Verified patient identification, verified procedure, site/side was marked, verified correct patient position, special equipment/implants available, medications/allergies/relevent history reviewed, required imaging and test results available.  Performed  Maximum sterile technique was used including gloves, gown, hand hygiene and mask.  MAC and 3    Evaluation Hemodynamic Status: BP stable throughout; O2 sats: stable throughout Patient's Current Condition: stable Complications: No apparent complications Patient did tolerate procedure well. Chest X-ray ordered to verify placement.  CXR: pending.   Tiffany Johns 09/20/2011

## 2011-09-20 NOTE — Op Note (Signed)
[facility] [address] [place], [place]  [address]  ENDOSCOPY PROCEDURE REPORT  PATIENT:  Tiffany Johns  MR#:  #[id number] BIRTHDATE:  [birth year], 30 yrs. old  GENDER:  female ENDOSCOPIST:  Carie Caddy. Lorenna Lurry, MD Referred by:  Leslye Peer, M.D. PROCEDURE DATE:  09/20/2011 PROCEDURE:  EGD, diagnostic 43235 ASA CLASS:  Class III INDICATIONS:  caustic ingestion MEDICATIONS:   Fentanyl 25 mcg IV, Versed 2 mg IV TOPICAL ANESTHETIC:  none  DESCRIPTION OF PROCEDURE:   After the risks benefits and alternatives of the procedure were thoroughly explained, informed consent was obtained.  The Pentax Gastroscope [id number] endoscope was introduced through the mouth and advanced to the second portion of the duodenum, without limitations.  The instrument was slowly withdrawn as the mucosa was fully examined. <<PROCEDUREIMAGES>>  Mild esophagitis characterized by erythema and a few small erosions was found at the gastroesophageal junction.  Otherwise very mild erythema in the distal esophagus, with remaining portions of esophagus normal appearing.  Retained substance, white in color, was present in the fundus.  This was irrigate and lavaged through the scope.  Moderate hemorrhagic gastritis was found in the fundus near and under the location o the retained foreign substance.  Otherwise normal stomach.  The duodenal bulb was normal in appearance, as was the postbulbar duodenum. Retroflexed views revealed findings as previously described.    The scope was then withdrawn from the patient and the procedure completed.  COMPLICATIONS:  None  ENDOSCOPIC IMPRESSION: 1) Mild esophagitis at the gastroesophageal junction 2) Mild erythema in the distal esophagus, remaining portions of the esophagus appear normal. Esophageal Injury Grade = 1. 3) Whit substance, retained in the fundus.  Irrigated and lavaged via scope. 4) Moderate hemorrhagic gastritis in the fundus at area  o foreign substance. 5) Otherwise normal stomach 6) Normal duodenum  RECOMMENDATIONS: 1) ICU monitoring 2) From GI/caustic ingestion standpoint I recommend liquid diet only for 24-48 hours and then diet can be advanced as tolerated. 3) For gastritis would continue PPI once daily for 4 weeks.  Carie Caddy. Rhea Belton, MD  n. Rosalie DoctorCarie Caddy. Zakhi Dupre at 09/20/2011 01:54 PM  Tiffany Johns, #[id number]

## 2011-09-20 NOTE — ED Notes (Signed)
Pt sitting up in bed opening eyes and responding to some questions. Husband remains at bedside

## 2011-09-20 NOTE — H&P (Signed)
Tiffany Johns is an 31 y.o. female.   Chief Complaint: AMS HPI: 31 yo [name] who was brought in by family after taking some white powdered substance and developing AMS. It is not clear if this was attempted suicide or accidental. Patient cannot give adequate history and is unable to communicate due to Language barrier and AMS. No focal weakness and family do not know what the substance is but they brought it in.  History reviewed. No pertinent past medical history.  History reviewed. No pertinent past surgical history.  History reviewed. No pertinent family history. Social History:  does not have a smoking history on file. She does not have any smokeless tobacco history on file. Her alcohol and drug histories not on file.  Allergies: Not on File  Medications Prior to Admission  Medication Dose Route Frequency Provider Last Rate Last Dose  . haloperidol lactate (HALDOL) injection 0.5 mg  0.5 mg Intravenous Q6H Rometta Emery, MD   0.5 mg at 09/20/11 0425  . naloxone (NARCAN) 0.4 MG/ML injection           . ondansetron (ZOFRAN) 4 MG/2ML injection           . ondansetron (ZOFRAN) injection 4 mg  4 mg Intravenous Once [employer]. Pickering, MD   4 mg at 09/19/11 2258  . potassium chloride SA (K-DUR,KLOR-CON) CR tablet 40 mEq  40 mEq Oral Once Rometta Emery, MD   40 mEq at 09/20/11 0425  . sodium chloride 0.9 % bolus 1,000 mL  1,000 mL Intravenous Once Harrold Donath R. Pickering, MD   1,000 mL at 09/19/11 2245  . DISCONTD: haloperidol lactate (HALDOL) injection 1 mg  1 mg Intravenous Q6H Rometta Emery, MD       No current outpatient prescriptions on file as of 09/20/2011.    Results for orders placed during the hospital encounter of 09/19/11 (from the past 48 hour(s))  CBC     Status: Abnormal   Collection Time   09/19/11 10:52 PM      Component Value Range Comment   WBC 12.5 (*) 4.0 - 10.5 (K/uL)    RBC 4.52  3.87 - 5.11 (MIL/uL)    Hemoglobin 13.2  12.0 - 15.0 (g/dL)    HCT 16.1   09.6 - 04.5 (%)    MCV 84.5  78.0 - 100.0 (fL)    MCH 29.2  26.0 - 34.0 (pg)    MCHC 34.6  30.0 - 36.0 (g/dL)    RDW 40.9  81.1 - 91.4 (%)    Platelets 161  150 - 400 (K/uL)   DIFFERENTIAL     Status: Abnormal   Collection Time   09/19/11 10:52 PM      Component Value Range Comment   Neutrophils Relative 77  43 - 77 (%)    Neutro Abs 9.6 (*) 1.7 - 7.7 (K/uL)    Lymphocytes Relative 17  12 - 46 (%)    Lymphs Abs 2.2  0.7 - 4.0 (K/uL)    Monocytes Relative 5  3 - 12 (%)    Monocytes Absolute 0.7  0.1 - 1.0 (K/uL)    Eosinophils Relative 0  0 - 5 (%)    Eosinophils Absolute 0.0  0.0 - 0.7 (K/uL)    Basophils Relative 0  0 - 1 (%)    Basophils Absolute 0.0  0.0 - 0.1 (K/uL)   COMPREHENSIVE METABOLIC PANEL     Status: Abnormal   Collection Time   09/19/11 10:52  PM      Component Value Range Comment   Sodium 138  135 - 145 (mEq/L)    Potassium 3.0 (*) 3.5 - 5.1 (mEq/L)    Chloride 102  96 - 112 (mEq/L)    CO2 21  19 - 32 (mEq/L)    Glucose, Bld 96  70 - 99 (mg/dL)    BUN 16  6 - 23 (mg/dL)    Creatinine, Ser 1.61  0.50 - 1.10 (mg/dL)    Calcium 9.0  8.4 - 10.5 (mg/dL)    Total Protein 7.2  6.0 - 8.3 (g/dL)    Albumin 3.9  3.5 - 5.2 (g/dL)    AST 19  0 - 37 (U/L)    ALT 15  0 - 35 (U/L)    Alkaline Phosphatase 54  39 - 117 (U/L)    Total Bilirubin 1.1  0.3 - 1.2 (mg/dL)    GFR calc non Af Amer >90  >90 (mL/min)    GFR calc Af Amer >90  >90 (mL/min)   ETHANOL     Status: Normal   Collection Time   09/19/11 10:52 PM      Component Value Range Comment   Alcohol, Ethyl (B) <11  0 - 11 (mg/dL)   ACETAMINOPHEN LEVEL     Status: Normal   Collection Time   09/19/11 10:52 PM      Component Value Range Comment   Acetaminophen (Tylenol), Serum <15.0  10 - 30 (ug/mL)   SALICYLATE LEVEL     Status: Abnormal   Collection Time   09/19/11 10:52 PM      Component Value Range Comment   Salicylate Lvl <2.0 (*) 2.8 - 20.0 (mg/dL)   LACTIC ACID, PLASMA     Status: Normal   Collection Time    09/19/11 10:52 PM      Component Value Range Comment   Lactic Acid, Venous 2.0  0.5 - 2.2 (mmol/L)   PREGNANCY, URINE     Status: Normal   Collection Time   09/19/11 10:56 PM      Component Value Range Comment   Preg Test, Ur NEGATIVE  NEGATIVE    URINE RAPID DRUG SCREEN (HOSP PERFORMED)     Status: Normal   Collection Time   09/19/11 10:56 PM      Component Value Range Comment   Opiates NONE DETECTED  NONE DETECTED     Cocaine NONE DETECTED  NONE DETECTED     Benzodiazepines NONE DETECTED  NONE DETECTED     Amphetamines NONE DETECTED  NONE DETECTED     Tetrahydrocannabinol NONE DETECTED  NONE DETECTED     Barbiturates NONE DETECTED  NONE DETECTED    POCT I-STAT, CHEM 8     Status: Abnormal   Collection Time   09/19/11 11:09 PM      Component Value Range Comment   Sodium 142  135 - 145 (mEq/L)    Potassium 3.2 (*) 3.5 - 5.1 (mEq/L)    Chloride 106  96 - 112 (mEq/L)    BUN 17  6 - 23 (mg/dL)    Creatinine, Ser 0.96  0.50 - 1.10 (mg/dL)    Glucose, Bld 92  70 - 99 (mg/dL)    Calcium, Ion 0.45  1.12 - 1.32 (mmol/L)    TCO2 21  0 - 100 (mmol/L)    Hemoglobin 14.3  12.0 - 15.0 (g/dL)    HCT 40.9  81.1 - 91.4 (%)   ETHANOL  Status: Normal   Collection Time   09/20/11  4:30 AM      Component Value Range Comment   Alcohol, Ethyl (B) <11  0 - 11 (mg/dL)   CARDIAC PANEL(CRET KIN+CKTOT+MB+TROPI)     Status: Normal   Collection Time   09/20/11  4:30 AM      Component Value Range Comment   Total CK 121  7 - 177 (U/L)    CK, MB 1.9  0.3 - 4.0 (ng/mL)    Troponin I <0.30  <0.30 (ng/mL)    Relative Index 1.6  0.0 - 2.5     Dg Chest 1 View  09/19/2011  *RADIOLOGY REPORT*  Clinical Data: Unresponsive  CHEST - 1 VIEW  Comparison: None.  Findings: Lungs are clear. No pleural effusion or pneumothorax. The cardiomediastinal contours are within normal limits. The visualized bones and soft tissues are without significant appreciable abnormality.  IMPRESSION: No acute cardiopulmonary process.   Original Report Authenticated By: Waneta Martins, M.D.   Ct Head Wo Contrast  09/19/2011  *RADIOLOGY REPORT*  Clinical Data: Unresponsive  CT HEAD WITHOUT CONTRAST  Technique:  Contiguous axial images were obtained from the base of the skull through the vertex without contrast.  Comparison: None.  Findings: Left greater than right periventricular hypoattenuation. No associated mass effect.  There is no evidence for acute hemorrhage, hydrocephalus, mass lesion, or abnormal extra-axial fluid collection.  No definite CT evidence for acute infarction.  IMPRESSION: Mild periventricular white matter hypoattenuation is nonspecific.  Otherwise, no acute intracranial abnormality.  Original Report Authenticated By: Waneta Martins, M.D.    Review of Systems  Unable to perform ROS: mental status change    Blood pressure 130/89, pulse 106, temperature 97 F (36.1 C), temperature source Axillary, resp. rate 22, weight 50.3 kg (110 lb 14.3 oz), SpO2 95.00%. Physical Exam  Constitutional: She appears well-developed and well-nourished. She appears listless.  HENT:  Head: Normocephalic and atraumatic.  Right Ear: External ear normal.  Left Ear: External ear normal.  Nose: Nose normal.  Mouth/Throat: Oropharynx is clear and moist.  Eyes: Conjunctivae and EOM are normal. Pupils are equal, round, and reactive to light.  Neck: Normal range of motion. Neck supple.  Cardiovascular: Normal rate, regular rhythm, normal heart sounds and intact distal pulses.   Respiratory: Effort normal and breath sounds normal.  GI: Soft. Bowel sounds are normal.  Musculoskeletal: Normal range of motion.  Neurological: She appears listless. She displays no atrophy. A sensory deficit is present. No cranial nerve deficit. She exhibits normal muscle tone. Coordination normal.  Skin: Skin is warm, dry and intact.  Psychiatric: Her affect is inappropriate. Her speech is delayed. Cognition and memory are impaired.      Assessment/Plan 1. Encephalopathy: toxic Metabolic probably due to chemical substance. Admit for observation and Tele monitoring, frequent neuro checks, Suicide precaution until cleared, sitter in room. 2. Hypokalemia: probably Vomited.Replete 3. Leucocytosis: probably from the poisoning. Monitor   Lariya Kinzie,LAWAL 09/20/2011, 5:57 AM

## 2011-09-20 NOTE — Progress Notes (Signed)
After review of the case and spoke with pharmacy and poison control.  The patient was also examined, the EGD showed some hemorrhagic gastritis.  ET tube was adjusted, ABG ordered and vent adjusted accordingly.  With poison control and pharmacy's help will dose 2pan for 24 hours and reevaluate.  LFTs are ordered for tomorrow and will attempt to decrease sedation for AM to evaluate mental status.  Additional CC time of 60 minutes.

## 2011-09-20 NOTE — Progress Notes (Signed)
Called to patients bedside at 0808, pt ingested unknown substance at home, bedside RN concerned regarding pt's mental status. Upon assessment pt lethargic, assisted patient to sit on side of bed, patient able to hold herself up and moving air appropriately. Recommended to bedside nurse to page attending MD regarding continual AMS and possible need to contact poision control. Will continue to monitor, Dr. Renette Butters returned paged and will make patient first on list to see this am

## 2011-09-20 NOTE — Progress Notes (Signed)
Pull ETT back to 21cm at lip per order.

## 2011-09-20 NOTE — ED Notes (Signed)
PT is responding to commands to open eyes and will nod head to questions. Family at bedside.

## 2011-09-20 NOTE — Consult Note (Signed)
Patient seen, examined, and I agree with the above documentation, including the assessment and plan. Pt is intubated and sedated.  Concern is more poison ingestion, concern is for a basic substance. Will perform EGD to assess esophageal injury which will help guide management. Consent obtained from patient's husband.

## 2011-09-21 ENCOUNTER — Inpatient Hospital Stay (HOSPITAL_COMMUNITY): Payer: Medicaid Other

## 2011-09-21 LAB — BASIC METABOLIC PANEL
Chloride: 103 mEq/L (ref 96–112)
GFR calc Af Amer: >90 mL/min (ref >90)
GFR calc non Af Amer: >90 mL/min (ref >90)
Glucose, Bld: 70 mg/dL (ref 70–99)
Potassium: 3.6 mEq/L (ref 3.5–5.1)
Sodium: 132 mEq/L — ABNORMAL LOW (ref 135–145)

## 2011-09-21 LAB — CBC
Platelets: 165 10*3/uL (ref 150–400)
RBC: 4.46 MIL/uL (ref 3.87–5.11)
RDW: 13.1 % (ref 11.5–15.5)
WBC: 18.7 10*3/uL — ABNORMAL HIGH (ref 4.0–10.5)

## 2011-09-21 LAB — PHOSPHORUS: Phosphorus: 2.7 mg/dL (ref 2.3–4.6)

## 2011-09-21 NOTE — Consult Note (Signed)
Name: Tiffany Johns MRN: [id number] DOB: [birth year]    LOS: 2  [place] [facility] Progress  Note   History of Present Illness:  31 y/o [place] female presented to [facility] ED on 3/15 after ingesting an unknown white substance at home and subsequently developing AMS.  Admitted to medical floor.  PCCM called am of 3/15 for AMS and concern for airway protection.  Tx to ICU for intubation in setting of depressed mental status.    Subj: More alert, following commands  Lines / Drains: 3/15 OETT>>>3/16  Cultures / Micro: 3/15 UA>>> 3/15 UDS>>> 3/15 lactate>>>  Antibiotics:  Tests / Events: 3/15 CT HEAD>>>Mild periventricular white matter hypoattenuation is nonspecific. Otherwise, no acute intracranial abnormality.   History reviewed. No pertinent past medical history.  History reviewed. No pertinent past surgical history.  Prior to Admission medications   Not on File    Allergies No Known Allergies  Family History History reviewed. No pertinent family history.  Social History  does not have a smoking history on file. She does not have any smokeless tobacco history on file. Her alcohol and drug histories not on file.  Review Of Systems: unable to complete  Vital Signs: Filed Vitals:   09/21/11 0756 09/21/11 0758 09/21/11 0800 09/21/11 0900  BP: 100/71 100/71 105/68 102/63  Pulse: 117 123 117 118  Temp: 100.2 F (37.9 C) 100.2 F (37.9 C)    TempSrc: Oral Oral    Resp: 29 24 18 23   Height:      Weight:      SpO2: 100% 100%  100%    I/O last 3 completed shifts: In: 1605.2 [P.O.:60; I.V.:1445.2; IV Piggyback:100] Out: 3160 [Urine:3160]  Physical Examination: General: wdwn adult female with improved mental status Neuro: awake and alert CV: s1s2 tachy, regular PULM: clear GI: flat, soft, bsx4 active Extremities: warm/,dry, no edema   Ventilator settings: Vent Mode:  [-] CPAP FiO2 (%):  [30 %-80 %] 30 % Set Rate:  [16 bmp] 16 bmp Vt Set:  [450  mL] 450 mL PEEP:  [5 cmH20] 5 cmH20 Pressure Support:  [5 cmH20] 5 cmH20 Plateau Pressure:  [14 cmH20-15 cmH20] 15 cmH20  Labs    CBC  Lab 09/21/11 0600 09/20/11 1153 09/19/11 2309 09/19/11 2252  HGB 13.1 13.1 14.3 --  HCT 38.0 38.1 42.0 --  WBC 18.7* 19.5* -- 12.5*  PLT 165 191 -- 161    BMET  Lab 09/21/11 0600 09/20/11 1153 09/19/11 2309 09/19/11 2252  NA 132* 136 142 138  K 3.6 3.8 -- --  CL 103 104 106 102  CO2 11* 15* -- 21  GLUCOSE 70 96 92 96  BUN 5* 8 17 16   CREATININE 0.52 0.40* 0.60 0.62  CALCIUM 8.9 8.0* -- 9.0  MG 1.7 -- -- --  PHOS 2.7 -- -- --     Lab 09/20/11 1540 09/20/11 1025 09/19/11 2309  PHART 7.348* 7.338* --  PCO2ART 23.7* 30.7* --  PO2ART 435.0* 101.0* --  HCO3 12.7* 16.1* --  TCO2 13.4 17.0 21  O2SAT 99.9 97.9 --     Radiology:    Assessment and Plan:  AMS in setting of reported ingestion of unknown white substance Assessment: CT head negative.  Reported ingestion of glass of white material from bucket under sink.  Patient with progressive AMS. ? What substance was  Plan: Monitor  Acute Respiratory Failure Better with improved mental status Plan Extubate this am 3/16  Gastritis d/t ingestion   No perforation  Assessment: in setting of ingestion of unknown agent LFTs ok  Lab 09/20/11 1153 09/19/11 2252  AST 22 19  ALT 13 15  ALKPHOS 52 54  BILITOT 1.0 1.1  PROT 6.8 7.2  ALBUMIN 3.5 3.9  INR -- --    Plan: -PPI  Sinus Tachycardia No QTc issues on serial ECGs Plan: -serial EKG's daily -avoid medications that prolong QTc   Hypokalemia  Lab 09/21/11 0600 09/20/11 1153 09/19/11 2309 09/19/11 2252  K 3.6 3.8 3.2* 3.0*    Assessment: Plan: -replete PRN -f/u BMP   Leucocytosis  Lab 09/21/11 0600 09/20/11 1153 09/19/11 2252  WBC 18.7* 19.5* 12.5*    Assessment: ? If stress response Plan: -monitor -hold abx for now as no suspicion of infectious process   Best practices / Disposition: -->Code Status:   Full code -->DVT Px: SCD's  -->GI Px: protonix -->Diet: NPO   Austin Miles  [phone]  Cell  [phone]  If no response or cell goes to voicemail, call beeper [phone]  09/21/2011, 10:06 AM

## 2011-09-21 NOTE — ED Provider Notes (Signed)
Patient with altered mental status. Reportedly found unresponsive by her family members. There was some white powder around her mouth and her feet. In the ER she was protecting her airway. PH strip was done and does not show acidosis or alkalosis of her mouth. She does not appear to need intubation at this time. She is not febrile. She does not speak English and there is somewhat of a language barrier with her family members. She had a mild improvement with Narcan. Per family member she does not abuse substances. She has no seizure history. Head CT is reassuring. Per family member she just keeps asking for water. Family members went home and got the substances that she may have drank. She be admitted to the hospital.  CRITICAL CARE Performed by: Billee Cashing   Total critical care time:30  Critical care time was exclusive of separately billable procedures and treating other patients.  Critical care was necessary to treat or prevent imminent or life-threatening deterioration.  Critical care was time spent personally by me on the following activities: development of treatment plan with patient and/or surrogate as well as nursing, discussions with consultants, evaluation of patient's response to treatment, examination of patient, obtaining history from patient or surrogate, ordering and performing treatments and interventions, ordering and review of laboratory studies, ordering and review of radiographic studies, pulse oximetry and re-evaluation of patient's condition.  I saw and evaluated the patient, reviewed the resident's note and I agree with the findings and plan and agree with their ECG interpretation.   Juliet Rude. Rubin Payor, MD 09/21/11 [phone]

## 2011-09-21 NOTE — Procedures (Signed)
Extubation Procedure Note  Patient Details:   Name: Tiffany Johns DOB: Oct 11, 1980 MRN: [id number]   Airway Documentation:    Patient extubated per MD order.  On room air, sats 100%.  Pt able to vocalize post extubation.  No stridor or respiratory distress noted.  RT will monitor.   Evaluation  O2 sats: stable throughout Complications: No apparent complications Patient did tolerate procedure well. Bilateral Breath Sounds: Diminished;Rhonchi;Clear Suctioning: Airway Yes  Lasker Lions 09/21/2011, 10:27 AM

## 2011-09-21 NOTE — Consult Note (Signed)
Reason for Consult: Depressed? OD   Tiffany Johns is an 31 y.o. female.   HPI: 31 yo Tiffany Johns who was brought in by family after taking some white powdered substance and developing AMS. It is not clear if this was attempted suicide or accidental. Patient could give adequate history due to Language barrier and AMS. No focal weakness and family do not know what the substance is but they brought it in.   Tried to talk with her today with the help of phone interpretor in the absence of her husband. Pt admitted it as a SI attempt but it was not possible to understand her today due to slow, low volume speech and hoarseness. She agreed talk more tomorrow. Per husband there are no stressors for her.  Social History:  does not have a smoking history on file. She does not have any smokeless tobacco history on file. Her alcohol and drug histories not on file.  Allergies: No Known Allergies  Medications: I have reviewed the patient's current medications.  Results for orders placed during the hospital encounter of 09/19/11 (from the past 48 hour(s))  GLUCOSE, CAPILLARY     Status: Normal   Collection Time   09/19/11 10:41 PM      Component Value Range Comment   Glucose-Capillary 84  70 - 99 (mg/dL)    Comment 1 Orig Pt Id entered as 000000     CBC     Status: Abnormal   Collection Time   09/19/11 10:52 PM      Component Value Range Comment   WBC 12.5 (*) 4.0 - 10.5 (K/uL)    RBC 4.52  3.87 - 5.11 (MIL/uL)    Hemoglobin 13.2  12.0 - 15.0 (g/dL)    HCT 16.1  09.6 - 04.5 (%)    MCV 84.5  78.0 - 100.0 (fL)    MCH 29.2  26.0 - 34.0 (pg)    MCHC 34.6  30.0 - 36.0 (g/dL)    RDW 40.9  81.1 - 91.4 (%)    Platelets 161  150 - 400 (K/uL)   DIFFERENTIAL     Status: Abnormal   Collection Time   09/19/11 10:52 PM      Component Value Range Comment   Neutrophils Relative 77  43 - 77 (%)    Neutro Abs 9.6 (*) 1.7 - 7.7 (K/uL)    Lymphocytes Relative 17  12 - 46 (%)    Lymphs Abs 2.2  0.7 - 4.0 (K/uL)     Monocytes Relative 5  3 - 12 (%)    Monocytes Absolute 0.7  0.1 - 1.0 (K/uL)    Eosinophils Relative 0  0 - 5 (%)    Eosinophils Absolute 0.0  0.0 - 0.7 (K/uL)    Basophils Relative 0  0 - 1 (%)    Basophils Absolute 0.0  0.0 - 0.1 (K/uL)   COMPREHENSIVE METABOLIC PANEL     Status: Abnormal   Collection Time   09/19/11 10:52 PM      Component Value Range Comment   Sodium 138  135 - 145 (mEq/L)    Potassium 3.0 (*) 3.5 - 5.1 (mEq/L)    Chloride 102  96 - 112 (mEq/L)    CO2 21  19 - 32 (mEq/L)    Glucose, Bld 96  70 - 99 (mg/dL)    BUN 16  6 - 23 (mg/dL)    Creatinine, Ser 7.82  0.50 - 1.10 (mg/dL)    Calcium 9.0  8.4 - 10.5 (mg/dL)    Total Protein 7.2  6.0 - 8.3 (g/dL)    Albumin 3.9  3.5 - 5.2 (g/dL)    AST 19  0 - 37 (U/L)    ALT 15  0 - 35 (U/L)    Alkaline Phosphatase 54  39 - 117 (U/L)    Total Bilirubin 1.1  0.3 - 1.2 (mg/dL)    GFR calc non Af Amer >90  >90 (mL/min)    GFR calc Af Amer >90  >90 (mL/min)   ETHANOL     Status: Normal   Collection Time   09/19/11 10:52 PM      Component Value Range Comment   Alcohol, Ethyl (B) <11  0 - 11 (mg/dL)   ACETAMINOPHEN LEVEL     Status: Normal   Collection Time   09/19/11 10:52 PM      Component Value Range Comment   Acetaminophen (Tylenol), Serum <15.0  10 - 30 (ug/mL)   SALICYLATE LEVEL     Status: Abnormal   Collection Time   09/19/11 10:52 PM      Component Value Range Comment   Salicylate Lvl <2.0 (*) 2.8 - 20.0 (mg/dL)   LACTIC ACID, PLASMA     Status: Normal   Collection Time   09/19/11 10:52 PM      Component Value Range Comment   Lactic Acid, Venous 2.0  0.5 - 2.2 (mmol/L)   PREGNANCY, URINE     Status: Normal   Collection Time   09/19/11 10:56 PM      Component Value Range Comment   Preg Test, Ur NEGATIVE  NEGATIVE    URINE RAPID DRUG SCREEN (HOSP PERFORMED)     Status: Normal   Collection Time   09/19/11 10:56 PM      Component Value Range Comment   Opiates NONE DETECTED  NONE DETECTED     Cocaine NONE  DETECTED  NONE DETECTED     Benzodiazepines NONE DETECTED  NONE DETECTED     Amphetamines NONE DETECTED  NONE DETECTED     Tetrahydrocannabinol NONE DETECTED  NONE DETECTED     Barbiturates NONE DETECTED  NONE DETECTED    POCT I-STAT, CHEM 8     Status: Abnormal   Collection Time   09/19/11 11:09 PM      Component Value Range Comment   Sodium 142  135 - 145 (mEq/L)    Potassium 3.2 (*) 3.5 - 5.1 (mEq/L)    Chloride 106  96 - 112 (mEq/L)    BUN 17  6 - 23 (mg/dL)    Creatinine, Ser 1.61  0.50 - 1.10 (mg/dL)    Glucose, Bld 92  70 - 99 (mg/dL)    Calcium, Ion 0.96  1.12 - 1.32 (mmol/L)    TCO2 21  0 - 100 (mmol/L)    Hemoglobin 14.3  12.0 - 15.0 (g/dL)    HCT 04.5  40.9 - 81.1 (%)   ETHANOL     Status: Normal   Collection Time   09/20/11  4:30 AM      Component Value Range Comment   Alcohol, Ethyl (B) <11  0 - 11 (mg/dL)   AMMONIA     Status: Normal   Collection Time   09/20/11  4:30 AM      Component Value Range Comment   Ammonia 11  11 - 60 (umol/L)   TSH     Status: Normal   Collection Time   09/20/11  4:30  AM      Component Value Range Comment   TSH 0.406  0.350 - 4.500 (uIU/mL)   CARDIAC PANEL(CRET KIN+CKTOT+MB+TROPI)     Status: Normal   Collection Time   09/20/11  4:30 AM      Component Value Range Comment   Total CK 121  7 - 177 (U/L)    CK, MB 1.9  0.3 - 4.0 (ng/mL)    Troponin I <0.30  <0.30 (ng/mL)    Relative Index 1.6  0.0 - 2.5    BLOOD GAS, ARTERIAL     Status: Abnormal   Collection Time   09/20/11 10:25 AM      Component Value Range Comment   FIO2 0.21      pH, Arterial 7.338 (*) 7.350 - 7.400     pCO2 arterial 30.7 (*) 35.0 - 45.0 (mmHg)    pO2, Arterial 101.0 (*) 80.0 - 100.0 (mmHg)    Bicarbonate 16.1 (*) 20.0 - 24.0 (mEq/L)    TCO2 17.0  0 - 100 (mmol/L)    Acid-base deficit 8.6 (*) 0.0 - 2.0 (mmol/L)    O2 Saturation 97.9      Patient temperature 98.6      Collection site LEFT RADIAL      Drawn by [id number]      Sample type ARTERIAL DRAW      Allens  test (pass/fail) PASS  PASS    BASIC METABOLIC PANEL     Status: Abnormal   Collection Time   09/20/11 11:53 AM      Component Value Range Comment   Sodium 136  135 - 145 (mEq/L)    Potassium 3.8  3.5 - 5.1 (mEq/L)    Chloride 104  96 - 112 (mEq/L)    CO2 15 (*) 19 - 32 (mEq/L)    Glucose, Bld 96  70 - 99 (mg/dL)    BUN 8  6 - 23 (mg/dL)    Creatinine, Ser 2.95 (*) 0.50 - 1.10 (mg/dL)    Calcium 8.0 (*) 8.4 - 10.5 (mg/dL)    GFR calc non Af Amer >90  >90 (mL/min)    GFR calc Af Amer >90  >90 (mL/min)   CBC     Status: Abnormal   Collection Time   09/20/11 11:53 AM      Component Value Range Comment   WBC 19.5 (*) 4.0 - 10.5 (K/uL)    RBC 4.45  3.87 - 5.11 (MIL/uL)    Hemoglobin 13.1  12.0 - 15.0 (g/dL)    HCT 28.4  13.2 - 44.0 (%)    MCV 85.6  78.0 - 100.0 (fL)    MCH 29.4  26.0 - 34.0 (pg)    MCHC 34.4  30.0 - 36.0 (g/dL)    RDW 10.2  72.5 - 36.6 (%)    Platelets 191  150 - 400 (K/uL)   HEPATIC FUNCTION PANEL     Status: Normal   Collection Time   09/20/11 11:53 AM      Component Value Range Comment   Total Protein 6.8  6.0 - 8.3 (g/dL)    Albumin 3.5  3.5 - 5.2 (g/dL)    AST 22  0 - 37 (U/L)    ALT 13  0 - 35 (U/L)    Alkaline Phosphatase 52  39 - 117 (U/L)    Total Bilirubin 1.0  0.3 - 1.2 (mg/dL)    Bilirubin, Direct 0.2  0.0 - 0.3 (mg/dL) HEMOLYSIS AT THIS LEVEL MAY AFFECT RESULT  Indirect Bilirubin 0.8  0.3 - 0.9 (mg/dL)   LACTIC ACID, PLASMA     Status: Normal   Collection Time   09/20/11 11:53 AM      Component Value Range Comment   Lactic Acid, Venous 1.9  0.5 - 2.2 (mmol/L)   TRICYCLICS SCREEN, URINE     Status: Normal   Collection Time   09/20/11 12:05 PM      Component Value Range Comment   TCA Scrn NONE DETECTED  NONE DETECTED    MRSA PCR SCREENING     Status: Normal   Collection Time   09/20/11 12:08 PM      Component Value Range Comment   MRSA by PCR NEGATIVE  NEGATIVE    BLOOD GAS, ARTERIAL     Status: Abnormal   Collection Time   09/20/11  3:40 PM        Component Value Range Comment   FIO2 0.80      Delivery systems VENTILATOR      Mode PRESSURE REGULATED VOLUME CONTROL      VT 450      Rate 16      Peep/cpap 5.0      pH, Arterial 7.348 (*) 7.350 - 7.400     pCO2 arterial 23.7 (*) 35.0 - 45.0 (mmHg)    pO2, Arterial 435.0 (*) 80.0 - 100.0 (mmHg)    Bicarbonate 12.7 (*) 20.0 - 24.0 (mEq/L)    TCO2 13.4  0 - 100 (mmol/L)    Acid-base deficit 11.9 (*) 0.0 - 2.0 (mmol/L)    O2 Saturation 99.9      Patient temperature 98.6      Collection site A-LINE      Drawn by [id number]      Sample type ARTERIAL DRAW     BASIC METABOLIC PANEL     Status: Abnormal   Collection Time   09/21/11  6:00 AM      Component Value Range Comment   Sodium 132 (*) 135 - 145 (mEq/L)    Potassium 3.6  3.5 - 5.1 (mEq/L)    Chloride 103  96 - 112 (mEq/L)    CO2 11 (*) 19 - 32 (mEq/L)    Glucose, Bld 70  70 - 99 (mg/dL)    BUN 5 (*) 6 - 23 (mg/dL)    Creatinine, Ser 0.34  0.50 - 1.10 (mg/dL)    Calcium 8.9  8.4 - 10.5 (mg/dL)    GFR calc non Af Amer >90  >90 (mL/min)    GFR calc Af Amer >90  >90 (mL/min)   MAGNESIUM     Status: Normal   Collection Time   09/21/11  6:00 AM      Component Value Range Comment   Magnesium 1.7  1.5 - 2.5 (mg/dL)   PHOSPHORUS     Status: Normal   Collection Time   09/21/11  6:00 AM      Component Value Range Comment   Phosphorus 2.7  2.3 - 4.6 (mg/dL)   CBC     Status: Abnormal   Collection Time   09/21/11  6:00 AM      Component Value Range Comment   WBC 18.7 (*) 4.0 - 10.5 (K/uL)    RBC 4.46  3.87 - 5.11 (MIL/uL)    Hemoglobin 13.1  12.0 - 15.0 (g/dL)    HCT 74.2  59.5 - 63.8 (%)    MCV 85.2  78.0 - 100.0 (fL)    MCH 29.4  26.0 -  34.0 (pg)    MCHC 34.5  30.0 - 36.0 (g/dL)    RDW 40.9  81.1 - 91.4 (%)    Platelets 165  150 - 400 (K/uL)     Dg Chest 1 View  09/19/2011  *RADIOLOGY REPORT*  Clinical Data: Unresponsive  CHEST - 1 VIEW  Comparison: None.  Findings: Lungs are clear. No pleural effusion or pneumothorax.  The cardiomediastinal contours are within normal limits. The visualized bones and soft tissues are without significant appreciable abnormality.  IMPRESSION: No acute cardiopulmonary process.  Original Report Authenticated By: Waneta Martins, M.D.   Ct Head Wo Contrast  09/19/2011  *RADIOLOGY REPORT*  Clinical Data: Unresponsive  CT HEAD WITHOUT CONTRAST  Technique:  Contiguous axial images were obtained from the base of the skull through the vertex without contrast.  Comparison: None.  Findings: Left greater than right periventricular hypoattenuation. No associated mass effect.  There is no evidence for acute hemorrhage, hydrocephalus, mass lesion, or abnormal extra-axial fluid collection.  No definite CT evidence for acute infarction.  IMPRESSION: Mild periventricular white matter hypoattenuation is nonspecific.  Otherwise, no acute intracranial abnormality.  Original Report Authenticated By: Waneta Martins, M.D.   Dg Chest Port 1 View  09/21/2011  *RADIOLOGY REPORT*  Clinical Data: Altered mental status.  PORTABLE CHEST - 1 VIEW  Comparison: 09/20/2011  Findings: The endotracheal tube in good position.  Heart size and vascularity are normal.  Lungs are clear.  IMPRESSION: No acute abnormalities of the chest.  Endotracheal tube in good position.  Original Report Authenticated By: Gwynn Burly, M.D.   Dg Chest Port 1 View  09/20/2011  *RADIOLOGY REPORT*  Clinical Data: Evaluate endotracheal tube position.  PORTABLE CHEST - 1 VIEW  Comparison: Chest x-ray 09/20/2011.  Findings: Compared to the prior examination, the endotracheal tube has been pulled back, now properly positioned approximately 4 cm above the carina.  There continue to be opacities in the base of the left hemithorax, favored to represent subsegmental atelectasis. No focal airspace consolidation.  No definite pleural effusions. Pulmonary vasculature and the cardiomediastinal silhouette are within normal limits.  IMPRESSION: 1.   Endotracheal tube is now properly located.  The radiographic appearance of the chest is otherwise unchanged.  Original Report Authenticated By: Florencia Reasons, M.D.   Dg Chest Port 1 View  09/20/2011  *RADIOLOGY REPORT*  Clinical Data: Check ET tube position.  PORTABLE CHEST - 1 VIEW  Comparison: 09/20/2011  Findings: Endotracheal tube has been retracted and is now at the level of the carina.  This could be further retracted approximately 2 cm for optimal positioning.  Improving aeration in the left base. Minimal residual atelectasis.  Right lung is clear.  Heart is normal size.  IMPRESSION: Endotracheal tube tip now at the level of the carina.  This can be retracted further approximately 2 cm for optimal positioning.  Improving but persistent left base atelectasis.  Original Report Authenticated By: Cyndie Chime, M.D.   Dg Chest Port 1 View  09/20/2011  *RADIOLOGY REPORT*  Clinical Data: Endotracheal tube position.  PORTABLE CHEST - 1 VIEW  Comparison: 09/20/2011  Findings: Endotracheal tube remains in the right mainstem bronchus. Recommend pulling back approximately 4 cm.  Left base and perihilar atelectasis.  Right lung is clear.  Heart is normal size.  IMPRESSION: Continued right mainstem bronchus intubation.  Recommend retracting 4 cm.  The study was made a call report.  Original Report Authenticated By: Cyndie Chime, M.D.   Dg Chest  Port 1 View  09/20/2011  *RADIOLOGY REPORT*  Clinical Data: Endotracheal tube placement.  PORTABLE CHEST - 1 VIEW  Comparison: 09/19/2011  Findings: 1125 hours.  Endotracheal tube tip is in the right mainstem bronchus.  Right lung is clear.  There is some atelectasis at the left base. The cardiopericardial silhouette is within normal limits for size. Imaged bony structures of the thorax are intact. Telemetry leads overlie the chest.  IMPRESSION: Right main stem intubation.  Endotracheal tube could be pulled back about 4 cm for tip positioning in the distal trachea.   These critical test results were telephoned by me to [facility], the ICU nurse, at the time of interpretation (09/20/2011 11:50:58).  Original Report Authenticated By: ERIC A. MANSELL, M.D.    ROS Blood pressure 103/73, pulse 88, temperature 99.7 F (37.6 C), temperature source Oral, resp. rate 18, height 4\' 5"  (1.346 m), weight 49 kg (108 lb 0.4 oz), SpO2 100.00%. Physical Exam   MSE:  Good eye contact, slow volume, hard to listen, NO SI today, mood down, affect restricted. Logical  Assessment/  AXIS I: adjustment d/o with dep mood, r/o mdd AXIS II: Deferred AXIS III:s/p OD  AXIS [id number] at home, family stressors AXIS V: 15   Plan:   1. Continue 1;1 obs due to safety  2. Do not discharge. Will re-evaluate again tomorrow  Wonda Cerise 09/21/2011, 10:18 PM

## 2011-09-21 NOTE — Procedures (Signed)
EEG NUMBER:  [id number].  HISTORY:  This is a 31 years old woman with suicide attempt and ingestion of a caustic substance.  MEDICATIONS:  Versed, etomidate, and fentanyl.  CONDITION OF RECORDING:  This 16-lead EEG was recorded with the patient in awake, drowsy, sleep and sedated states.  Background rhythm: background patterns in wakefulness were well organized with a poorly sustained posterior dominant rhythm of 7.5 to 8 Hz, symmetrical and reactive to eye opening and closing.  Drowsiness was associated with mild attenuation of voltage and slowing frequencies.  Normal sleep patterns were seen.  Diffuse beta range patterns were noted.    Abnormal potentials: no epileptiform activity or focal slowing was noted.  ACTIVATION PROCEDURES:  Hyperventilation was not performed.  Photic stimulation was not performed.  EKG:  Single-channel of EKG monitoring was unremarkable.  IMPRESSION:  This was a normal awake, drowsy, sleep, and sedated EEG.  A normal EEG does not rule out the clinical diagnosis of epilepsy. If clinically warranted, a repeat extended EEG or ambulatory recording may be obtained for prolonged recording times, which may increase the diagnostic yield.  Clinical correlation is suggested.          ______________________________ Carmell Austria, MD    [id number] D:  09/20/2011 16:48:23  T:  09/20/2011 23:30:18  Job #:  [id number]

## 2011-09-22 DIAGNOSIS — J9600 Acute respiratory failure, unspecified whether with hypoxia or hypercapnia: Secondary | ICD-10-CM

## 2011-09-22 DIAGNOSIS — R4182 Altered mental status, unspecified: Secondary | ICD-10-CM

## 2011-09-22 DIAGNOSIS — T6591XA Toxic effect of unspecified substance, accidental (unintentional), initial encounter: Secondary | ICD-10-CM

## 2011-09-22 DIAGNOSIS — T6592XA Toxic effect of unspecified substance, intentional self-harm, initial encounter: Secondary | ICD-10-CM

## 2011-09-22 LAB — CBC
HCT: 34.8 % — ABNORMAL LOW (ref 36.0–46.0)
Hemoglobin: 11.9 g/dL — ABNORMAL LOW (ref 12.0–15.0)
MCV: 84.5 fL (ref 78.0–100.0)
RBC: 4.12 MIL/uL (ref 3.87–5.11)
WBC: 11.8 10*3/uL — ABNORMAL HIGH (ref 4.0–10.5)

## 2011-09-22 LAB — BASIC METABOLIC PANEL
CO2: 15 mEq/L — ABNORMAL LOW (ref 19–32)
Chloride: 109 mEq/L (ref 96–112)
GFR calc Af Amer: >90 mL/min (ref >90)
Potassium: 3.2 mEq/L — ABNORMAL LOW (ref 3.5–5.1)

## 2011-09-22 NOTE — Consult Note (Signed)
Reason for Consult: Depressed? OD   Tiffany Johns is an 31 y.o. female.   HPI: 31 yo [name] who was brought in by family after taking some white powdered substance and developing AMS. It is not clear if this was attempted suicide or accidental. Patient could give adequate history due to Language barrier and AMS. No focal weakness and family do not know what the substance is but they brought it in.   talked with her today with the help of phone interpretor in the absence of her husband. Pt admitted it was a SI attempt and she knew it was poisonous substance. When she did it, she was alone and later found by her husband (who was at home, may be in other room). She had argument with her husband about 3 days ago before this OD and they were not talking (for 3 days) with each other when she did OD. Per pt she was upset as her husband told her to not work and stay at home to take care of kids and his aged disabled parents. Per pt she wanted to do both but it was not approved by him. Limited social support around her. All her family in [place]. Doe not like to discuss family life outside home. Regrets what happened. Now plan to stop workoing as husband wants. she says they have discussed it after OD. She gave me the permission to talk with her husband today. denies any hx of depression in past and currently but was upset for the last few days. No hx of mental illness in past. Pt wants to go home and no interested for therapy future.   Yesterday husband reported that there are no stressors for her. But today he agreed what pt reported. husdand is concerned about her safety and also what happened. Per husband hey have not talked much after this OD and he is not sure how to solve the conflict and also keep her happy. Husband agreed incase she goes to [facility] unit once medically stable.  Social History:  does not have a smoking history on file. She does not have any smokeless tobacco history on file. Her alcohol  and drug histories not on file. has children, brother in law taking care of her. Was working in past. Husband also works. They live with husband's parents.  Allergies: No Known Allergies  Medications: I have reviewed the patient's current medications.  Results for orders placed during the hospital encounter of 09/19/11 (from the past 48 hour(s))  BASIC METABOLIC PANEL     Status: Abnormal   Collection Time   09/21/11  6:00 AM      Component Value Range Comment   Sodium 132 (*) 135 - 145 (mEq/L)    Potassium 3.6  3.5 - 5.1 (mEq/L)    Chloride 103  96 - 112 (mEq/L)    CO2 11 (*) 19 - 32 (mEq/L)    Glucose, Bld 70  70 - 99 (mg/dL)    BUN 5 (*) 6 - 23 (mg/dL)    Creatinine, Ser 4.54  0.50 - 1.10 (mg/dL)    Calcium 8.9  8.4 - 10.5 (mg/dL)    GFR calc non Af Amer >90  >90 (mL/min)    GFR calc Af Amer >90  >90 (mL/min)   MAGNESIUM     Status: Normal   Collection Time   09/21/11  6:00 AM      Component Value Range Comment   Magnesium 1.7  1.5 - 2.5 (mg/dL)  PHOSPHORUS     Status: Normal   Collection Time   09/21/11  6:00 AM      Component Value Range Comment   Phosphorus 2.7  2.3 - 4.6 (mg/dL)   CBC     Status: Abnormal   Collection Time   09/21/11  6:00 AM      Component Value Range Comment   WBC 18.7 (*) 4.0 - 10.5 (K/uL)    RBC 4.46  3.87 - 5.11 (MIL/uL)    Hemoglobin 13.1  12.0 - 15.0 (g/dL)    HCT 16.1  09.6 - 04.5 (%)    MCV 85.2  78.0 - 100.0 (fL)    MCH 29.4  26.0 - 34.0 (pg)    MCHC 34.5  30.0 - 36.0 (g/dL)    RDW 40.9  81.1 - 91.4 (%)    Platelets 165  150 - 400 (K/uL)   BASIC METABOLIC PANEL     Status: Abnormal   Collection Time   09/22/11  4:55 AM      Component Value Range Comment   Sodium 136  135 - 145 (mEq/L)    Potassium 3.2 (*) 3.5 - 5.1 (mEq/L)    Chloride 109  96 - 112 (mEq/L)    CO2 15 (*) 19 - 32 (mEq/L)    Glucose, Bld 86  70 - 99 (mg/dL)    BUN 5 (*) 6 - 23 (mg/dL)    Creatinine, Ser 7.82 (*) 0.50 - 1.10 (mg/dL)    Calcium 8.7  8.4 - 10.5 (mg/dL)      GFR calc non Af Amer >90  >90 (mL/min)    GFR calc Af Amer >90  >90 (mL/min)   CBC     Status: Abnormal   Collection Time   09/22/11  4:55 AM      Component Value Range Comment   WBC 11.8 (*) 4.0 - 10.5 (K/uL)    RBC 4.12  3.87 - 5.11 (MIL/uL)    Hemoglobin 11.9 (*) 12.0 - 15.0 (g/dL)    HCT 95.6 (*) 21.3 - 46.0 (%)    MCV 84.5  78.0 - 100.0 (fL)    MCH 28.9  26.0 - 34.0 (pg)    MCHC 34.2  30.0 - 36.0 (g/dL)    RDW 08.6  57.8 - 46.9 (%)    Platelets 170  150 - 400 (K/uL)     Dg Chest Port 1 View  09/21/2011  *RADIOLOGY REPORT*  Clinical Data: Altered mental status.  PORTABLE CHEST - 1 VIEW  Comparison: 09/20/2011  Findings: The endotracheal tube in good position.  Heart size and vascularity are normal.  Lungs are clear.  IMPRESSION: No acute abnormalities of the chest.  Endotracheal tube in good position.  Original Report Authenticated By: Gwynn Burly, M.D.    ROS  Blood pressure 119/63, pulse 78, temperature 98.6 F (37 C), temperature source Oral, resp. rate 20, height 4\' 5"  (1.346 m), weight 45.9 kg (101 lb 3.1 oz), SpO2 97.00%. Physical Exam    MSE:  Good eye contact, slow volume, hard to listen, NO SI today, mood down, affect restricted. Logical, good memory, limited insight  Assessment/  AXIS I: adjustment d/o with dep mood, r/o mdd AXIS II: Deferred AXIS III:s/p OD  AXIS IV: conflict at home, family stressors AXIS V: 15   Plan:   1. Continue 1;1 obs due to safety  2. reocmmend inpatient psy transfer once medically stable. Limited support at home, unresolved conflict, lethal OD is a concern at  this time.  Wonda Cerise 09/22/2011, 10:14 PM

## 2011-09-22 NOTE — Progress Notes (Signed)
Name: Tiffany Johns MRN: [id number] DOB: [birth year]    LOS: 3  [place] [facility] / Critical Care Progress  Note   History of Present Illness:  31 y/o [place] female presented to [facility] ED on 3/15 after ingesting an unknown white substance at home and subsequently developing AMS.  Admitted to medical floor.  PCCM called am of 3/15 for AMS and concern for airway protection.  Tx to ICU for intubation in setting of depressed mental status.    Subj: More alert, following commands  Lines / Drains: 3/15 OETT>>>3/16  Cultures / Micro: 3/15 UA>>>neg  3/15 UDS>>>neg  3/15 lactate>>>nl   Antibiotics:  Tests / Events: 3/15 CT HEAD>>>Mild periventricular white matter hypoattenuation is nonspecific. Otherwise, no acute intracranial abnormality.   History reviewed. No pertinent past medical history.  History reviewed. No pertinent past surgical history.  Prior to Admission medications   Not on File    Allergies No Known Allergies  Family History History reviewed. No pertinent family history.  Social History  does not have a smoking history on file. She does not have any smokeless tobacco history on file. Her alcohol and drug histories not on file.  Review Of Systems: unable to complete  Vital Signs: Filed Vitals:   09/22/11 0400 09/22/11 0500 09/22/11 0600 09/22/11 0700  BP: 99/79 100/66 100/70 103/66  Pulse: 81 94 67 76  Temp: 98.8 F (37.1 C)     TempSrc: Oral     Resp: 16 21 17 19   Height:      Weight: 45.9 kg (101 lb 3.1 oz)     SpO2: 98% 99% 98% 98%    I/O last 3 completed shifts: In: 4279.8 [P.O.:1080; I.V.:3189.8; IV Piggyback:10] Out: 3875 [Urine:3875]  Physical Examination: General: wdwn adult female with improved mental status Neuro: awake and alert CV: s1s2 tachy, regular PULM: clear GI: flat, soft, bsx4 active Extremities: warm/,dry, no edema      Labs    CBC  Lab 09/22/11 0455 09/21/11 0600 09/20/11 1153  HGB 11.9* 13.1 13.1  HCT 34.8* 38.0  38.1  WBC 11.8* 18.7* 19.5*  PLT 170 165 191    BMET  Lab 09/22/11 0455 09/21/11 0600 09/20/11 1153 09/19/11 2309 09/19/11 2252  NA 136 132* 136 142 138  K 3.2* 3.6 -- -- --  CL 109 103 104 106 102  CO2 15* 11* 15* -- 21  GLUCOSE 86 70 96 92 96  BUN 5* 5* 8 17 16   CREATININE 0.47* 0.52 0.40* 0.60 0.62  CALCIUM 8.7 8.9 8.0* -- 9.0  MG -- 1.7 -- -- --  PHOS -- 2.7 -- -- --     Lab 09/20/11 1540 09/20/11 1025 09/19/11 2309  PHART 7.348* 7.338* --  PCO2ART 23.7* 30.7* --  PO2ART 435.0* 101.0* --  HCO3 12.7* 16.1* --  TCO2 13.4 17.0 21  O2SAT 99.9 97.9 --         Assessment and Plan:  AMS in setting of reported ingestion of unknown white substance>>>AMS resolved.  Overdose attempt at suicide Assessment: CT head negative.  Reported ingestion of glass of white material from bucket under sink.  Patient with progressive AMS. ? What substance was  Plan: Monitor Psych consult>>saw pt late 3/16>>needs to reassess again today  Acute Respiratory Failure>>>resolved Better with improved mental status Plan Off oxygen  Gastritis d/t ingestion   No perforation Assessment: in setting of ingestion of unknown agent LFTs ok  Lab 09/20/11 1153 09/19/11 2252  AST 22 19  ALT 13 15  ALKPHOS 52 54  BILITOT 1.0 1.1  PROT 6.8 7.2  ALBUMIN 3.5 3.9  INR -- --    Plan: -PPI>>>change to PO  Sinus Tachycardia No QTc issues on serial ECGs Plan: No further QTc  Hypokalemia  Lab 09/22/11 0455 09/21/11 0600 09/20/11 1153 09/19/11 2309 09/19/11 2252  K 3.2* 3.6 3.8 3.2* 3.0*    Assessment: Plan: -repleteK  -f/u BMP   Leucocytosis  Lab 09/22/11 0455 09/21/11 0600 09/20/11 1153 09/19/11 2252  WBC 11.8* 18.7* 19.5* 12.5*    Assessment: resolving Plan: -monitor -hold abx for now as no suspicion of infectious process   Best practices / Disposition: -->Code Status:  Full code -->DVT Px: SCD's  -->GI Px: protonix -->Diet: PO  TFR to floor with sitter Shan Levans Beeper  [phone]  Cell  [phone]  If no response or cell goes to voicemail, call beeper [phone]  09/22/2011, 9:22 AM

## 2011-09-23 NOTE — Progress Notes (Signed)
Clinical Social Work with Psychiatry:  Following for disposition and referral to inpatient psych admission for OD and SI with plan.  Will refer to [facility] once medically stable.  At that time a dc summary/ progress note clearly stating the patient is medically clear will be needed.  Will follow up with Psych MD with regards to patient case.  Ashley Jacobs, MSW LCSW [phone]

## 2011-09-23 NOTE — Progress Notes (Signed)
Nutrition Follow-up  Diet Order:  Regular  Patient was extubated on 3/16. Was advanced to Regular diet on 3/17. Patient has been evaluated by psychiatrist due to suicide attempt. Plan to transfer to [facility] upon discharge.  LFT are improving. EGD showed some hemorrhagic gastritis due to substance ingestion. No perforation shown.  Last BM documented on 3/15. No wounds to document.  AMS and ARF are resolved  Meds: N/A   Continuous Infusions: N/A  PRN Meds:.N/A  Labs:  CMP     Component Value Date/Time   NA 136 09/22/2011 0455   K 3.2* 09/22/2011 0455   CL 109 09/22/2011 0455   CO2 15* 09/22/2011 0455   GLUCOSE 86 09/22/2011 0455   BUN 5* 09/22/2011 0455   CREATININE 0.47* 09/22/2011 0455   CALCIUM 8.7 09/22/2011 0455   PROT 6.8 09/20/2011 1153   ALBUMIN 3.5 09/20/2011 1153   AST 22 09/20/2011 1153   ALT 13 09/20/2011 1153   ALKPHOS 52 09/20/2011 1153   BILITOT 1.0 09/20/2011 1153   GFRNONAA >90 09/22/2011 0455   GFRAA >90 09/22/2011 0455     Intake/Output Summary (Last 24 hours) at 09/23/11 1056 Last data filed at 09/23/11 0900  Gross per 24 hour  Intake    920 ml  Output   1400 ml  Net   -480 ml    Spoke with patient, who was able to understand basic nutrition related questions. She denied any weight loss and stated her appetite was good prior to admission. Did not appear to be interested in supplementation or any additions to her meals. Patient did mention liking bread products as a possible snack option if no improvement in PO intake.  Patient's sitter reported patient only consumed half of apple for breakfast. Unsure of any additional PO intake as this was sitter's only meal observation  Weight Status: Current weight is 100 lbs (45.6 kg). Decreased 10 lbs from previous RD note on 3/15. May be related to fluid fluctuations.  Re-estimated needs:  1400 - 1500 kcal, 45- 55 gram protein   Nutrition Dx:  Inadequate oral intake (NI-2.1). Status: Progressing  Goal:   If unable to extubate within 24 - 48 hours, recommend initiation of nutrition support, unmet  -Goal is no longer applicable as patient has been advance to a regular diet  New Goal: Consume >/= 90% of estimated needs  Intervention:  Monitor PO intake and assess need for snack if PO intake continues to be < 25%  RD to follow nutrition care plan  Monitor:  PO intake, weight, labs, I/Os   Lloyd Huger Pager #:  [phone]  Kendell Bane Cornelison [phone]

## 2011-09-23 NOTE — Progress Notes (Signed)
Name: Tiffany Johns MRN: [id number] DOB: 08-21-1980    LOS: 4  [place] Pulmonary / Critical Care Progress  Note   History of Present Illness:  31 y/o [place] female presented to [facility] ED on 3/15 after ingesting an unknown white substance at home and subsequently developing AMS.  Admitted to medical floor.  PCCM called am of 3/15 for AMS and concern for airway protection.  Tx to ICU for intubation in setting of depressed mental status.    Subj: More alert, following commands  Lines / Drains: 3/15 OETT>>>3/16  Cultures / Micro: 3/15 UA>>>neg  3/15 UDS>>>neg  3/15 lactate>>>nl   Antibiotics:  Tests / Events: 3/15 CT HEAD>>>Mild periventricular white matter hypoattenuation is nonspecific. Otherwise, no acute intracranial abnormality.  History reviewed. No pertinent past medical history.  History reviewed. No pertinent past surgical history.  Prior to Admission medications   Not on File    Allergies No Known Allergies  Family History History reviewed. No pertinent family history.  Social History  does not have a smoking history on file. She does not have any smokeless tobacco history on file. Her alcohol and drug histories not on file.  Review Of Systems: unable to complete  Vital Signs: Filed Vitals:   09/23/11 0600 09/23/11 0700 09/23/11 0800 09/23/11 0900  BP: 98/60  98/62 101/61  Pulse: 65 73 66 59  Temp:   98.6 F (37 C)   TempSrc:   Oral   Resp: 18 17 18 17   Height:      Weight:      SpO2: 97% 98% 98% 98%    I/O last 3 completed shifts: In: 2615 [P.O.:1640; I.V.:975] Out: 2550 [Urine:2550]  Physical Examination: General: wdwn adult female with improved mental status Neuro: awake and alert CV: s1s2 tachy, regular PULM: clear GI: flat, soft, bsx4 active Extremities: warm/,dry, no edema  Labs    CBC  Lab 09/22/11 0455 09/21/11 0600 09/20/11 1153  HGB 11.9* 13.1 13.1  HCT 34.8* 38.0 38.1  WBC 11.8* 18.7* 19.5*  PLT 170 165 191   BMET  Lab  09/22/11 0455 09/21/11 0600 09/20/11 1153 09/19/11 2309 09/19/11 2252  NA 136 132* 136 142 138  K 3.2* 3.6 -- -- --  CL 109 103 104 106 102  CO2 15* 11* 15* -- 21  GLUCOSE 86 70 96 92 96  BUN 5* 5* 8 17 16   CREATININE 0.47* 0.52 0.40* 0.60 0.62  CALCIUM 8.7 8.9 8.0* -- 9.0  MG -- 1.7 -- -- --  PHOS -- 2.7 -- -- --    Lab 09/20/11 1540 09/20/11 1025 09/19/11 2309  PHART 7.348* 7.338* --  PCO2ART 23.7* 30.7* --  PO2ART 435.0* 101.0* --  HCO3 12.7* 16.1* --  TCO2 13.4 17.0 21  O2SAT 99.9 97.9 --   Assessment and Plan:  AMS in setting of reported ingestion of unknown white substance>>>AMS resolved.  Overdose attempt at suicide Assessment: CT head negative.  Reported ingestion of glass of white material from bucket under sink.  Patient with progressive AMS. ? What substance was  Plan: Monitor Psych consult>>saw 3/17 will need to be transferred to behavioral health once medically stable, should be ok to transfer by tomorrow after checking one last set of labs.  Acute Respiratory Failure>>>resolved Better with improved mental status Plan Off oxygen  Gastritis d/t ingestion   No perforation Assessment: in setting of ingestion of unknown agent LFTs ok  Lab 09/20/11 1153 09/19/11 2252  AST 22 19  ALT 13 15  ALKPHOS 52 54  BILITOT 1.0 1.1  PROT 6.8 7.2  ALBUMIN 3.5 3.9  INR -- --    Plan: -PPI>>>change to PO  Sinus Tachycardia No QTc issues on serial ECGs Plan: No further QTc  Hypokalemia  Lab 09/22/11 0455 09/21/11 0600 09/20/11 1153 09/19/11 2309 09/19/11 2252  K 3.2* 3.6 3.8 3.2* 3.0*    Assessment: Plan: -CMP in AM, if stable.   Leucocytosis  Lab 09/22/11 0455 09/21/11 0600 09/20/11 1153 09/19/11 2252  WBC 11.8* 18.7* 19.5* 12.5*    Assessment: resolving Plan: -monitor -hold abx for now as no suspicion of infectious process   Best practices / Disposition: -->Code Status:  Full code -->DVT Px: SCD's  -->GI Px: protonix -->Diet: PO  If  patient's LFTs are normal by AM will be medically stable to d/c to behavioral health.  TFR to floor with sitter  Alyson Reedy, M.D. [facility] Pulmonary/Critical Care Medicine. Pager: [phone]. After hours pager: [phone].

## 2011-09-23 NOTE — BH Assessment (Signed)
Pt is greeted. Sitter leaves briefly.  Pt says she feels good today.  She has two children and a husband.  She says she does not want to talk until she has a Nurse, learning disability who speaks [place]. RN says pt became very angry because she wanted to work and her husband wants her to stay home.  She mixed insecticide in water and drank it.  She says pt admits today it was a suicide attempt.   Pt is calm, smiling and willing to talk.  Will attempt to obtain a translator in am.  Note left for Psych CSW.

## 2011-09-23 NOTE — Discharge Summary (Signed)
Physician Discharge Summary  Patient ID: Tiffany Johns MRN: [id number] DOB/AGE: [birth year] 31 y.o.  Admit date: 09/19/2011 Discharge date: 09/24/2011    Discharge Diagnoses:   1. Acute intentional overdose 2. Acute Respiratory Failure 3. Depression       Brief Summary: Tiffany Johns is a 31 y.o. y/o [place] female who presented to [facility] ED on 3/15 after ingesting an unknown white substance at home and subsequently developing AMS. Admitted to medical floor. PCCM called am of 3/15 for AMS and concern for airway protection. Tx to ICU for intubation in setting of depressed mental status. She remained intubated for approximately 24 hours at which time she was successfully liberated from mechanical ventilation.  Poison Control was notified on regarding white substance and felt based on symptoms that this likely was an organophosphate overdose.  GI was consulted out of concern for esophageal burns / perforation risk.  Patient underwent EGD per GI and was noted to have hemorrhagic gastritis.  Foreign white substance removed during EGD.  Liquid diet was instituted and recommended a PPI for 4 weeks.  She was treated with 2-Pam per Poison Control recommendations.  Neurology evaluated patient with EEG and was felt to be normal when awake.  Psychiatry evaluated patient and recommend inpatient psychiatry care upon discharge.  Patient admits to suicide attempt in setting of multiple psychosocial issues.       Lines / Drains:  3/15 OETT>>>3/16   Cultures / Micro:  3/15 UA>>>neg  3/15 UDS>>>neg  3/15 lactate>>>nl   Antibiotics: none    Tests / Events:  3/15 CT HEAD>>>Mild periventricular white matter hypoattenuation is nonspecific. Otherwise, no acute intracranial abnormality  BP 85/52  Pulse 64  Temp(Src) 98.8 F (37.1 C) (Oral)  Resp 17  Ht 4\' 5"  (1.346 m)  Wt 46.5 kg (102 lb 8.2 oz)  BMI 25.66 kg/m2  SpO2 96%   Discharge Exam: General: wdwn adult female with improved mental status    Neuro: awake and alert  CV: s1s2 tachy, regular  PULM: clear  GI: flat, soft, bsx4 active  Extremities: warm/,dry, no edema   Discharge Labs  BMET  Lab 09/24/11 1400 09/24/11 0428 09/22/11 0455 09/21/11 0600 09/20/11 1153  NA 140 140 136 132* 136  K 4.3 2.6* -- -- --  CL 108 104 109 103 104  CO2 26 26 15* 11* 15*  GLUCOSE 113* 106* 86 70 96  BUN 7 7 5* 5* 8  CREATININE 0.43* 0.43* 0.47* 0.52 0.40*  CALCIUM 9.1 9.1 8.7 8.9 8.0*  MG -- 1.7 -- 1.7 --  PHOS -- 3.4 -- 2.7 --     CBC   Lab 09/24/11 0428 09/22/11 0455 09/21/11 0600  HGB 11.8* 11.9* 13.1  HCT 34.4* 34.8* 38.0  WBC 8.1 11.8* 18.7*  PLT 184 170 165    Discharge Orders    Future Orders Please Complete By Expires   Diet - low sodium heart healthy      Increase activity slowly        DISCHARGE MEDICATIONS  none    Disposition: Discharge to inpatient psychiatry.    Discharged Condition: Tiffany Johns has met maximum benefit of inpatient care and is medically stable and cleared for discharge to inpatient psychiatric care at [facility].  Patient is pending follow up as above.      Time spent on disposition:  Greater than 35 minutes.   D/C to behavioral health.  Patient seen and examined, agree with above note.  I dictated the  care and orders written for this patient under my direction.  Koren Bound, M.D. [phone]

## 2011-09-24 DIAGNOSIS — T6592XA Toxic effect of unspecified substance, intentional self-harm, initial encounter: Secondary | ICD-10-CM

## 2011-09-24 DIAGNOSIS — J9600 Acute respiratory failure, unspecified whether with hypoxia or hypercapnia: Secondary | ICD-10-CM

## 2011-09-24 DIAGNOSIS — T6591XA Toxic effect of unspecified substance, accidental (unintentional), initial encounter: Secondary | ICD-10-CM

## 2011-09-24 DIAGNOSIS — R4182 Altered mental status, unspecified: Secondary | ICD-10-CM

## 2011-09-24 LAB — BASIC METABOLIC PANEL
BUN: 7 mg/dL (ref 6–23)
BUN: 7 mg/dL (ref 6–23)
BUN: 7 mg/dL (ref 6–23)
CO2: 26 mEq/L (ref 19–32)
CO2: 26 mEq/L (ref 19–32)
Calcium: 9.1 mg/dL (ref 8.4–10.5)
Calcium: 9.4 mg/dL (ref 8.4–10.5)
Chloride: 108 mEq/L (ref 96–112)
Creatinine, Ser: 0.43 mg/dL — ABNORMAL LOW (ref 0.50–1.10)
GFR calc non Af Amer: >90 mL/min (ref >90)
Glucose, Bld: 106 mg/dL — ABNORMAL HIGH (ref 70–99)
Glucose, Bld: 115 mg/dL — ABNORMAL HIGH (ref 70–99)
Sodium: 140 mEq/L (ref 135–145)

## 2011-09-24 LAB — HEPATIC FUNCTION PANEL
ALT: 10 U/L (ref 0–35)
AST: 13 U/L (ref 0–37)
Bilirubin, Direct: 0.2 mg/dL (ref 0.0–0.3)
Total Protein: 6.7 g/dL (ref 6.0–8.3)

## 2011-09-24 LAB — CBC
HCT: 34.4 % — ABNORMAL LOW (ref 36.0–46.0)
Hemoglobin: 11.8 g/dL — ABNORMAL LOW (ref 12.0–15.0)
MCH: 28.4 pg (ref 26.0–34.0)
MCH: 30 pg (ref 26.0–34.0)
MCHC: 36.4 g/dL — ABNORMAL HIGH (ref 30.0–36.0)
Platelets: 196 10*3/uL (ref 150–400)
RBC: 4.16 MIL/uL (ref 3.87–5.11)

## 2011-09-24 LAB — PHOSPHORUS: Phosphorus: 3.4 mg/dL (ref 2.3–4.6)

## 2011-09-24 LAB — CHOLINESTERASE, RBC: Cholinesterase, RBC: 52

## 2011-09-24 LAB — PSEUDOCHOLINESTERASE: Pseudocholinesterase: 399 U/L — ABNORMAL LOW (ref 2900–7100)

## 2011-09-24 MED ORDER — ACETAMINOPHEN 325 MG PO TABS
650.0000 mg | ORAL_TABLET | Freq: Four times a day (QID) | ORAL | Status: DC | PRN
  Administered 2011-09-24 (×2): 650 mg via ORAL
  Filled 2011-09-24 (×3): qty 2

## 2011-09-24 MED ORDER — POTASSIUM CHLORIDE CRYS ER 20 MEQ PO TBCR
40.0000 meq | EXTENDED_RELEASE_TABLET | ORAL | Status: AC
  Administered 2011-09-24 (×2): 40 meq via ORAL
  Filled 2011-09-24 (×2): qty 2

## 2011-09-24 MED ORDER — POTASSIUM CHLORIDE CRYS ER 20 MEQ PO TBCR
40.0000 meq | EXTENDED_RELEASE_TABLET | Freq: Once | ORAL | Status: AC
  Administered 2011-09-24: 40 meq via ORAL
  Filled 2011-09-24: qty 2

## 2011-09-24 MED ORDER — PANTOPRAZOLE SODIUM 40 MG PO TBEC
40.0000 mg | DELAYED_RELEASE_TABLET | Freq: Two times a day (BID) | ORAL | Status: DC
  Administered 2011-09-25: 40 mg via ORAL
  Filled 2011-09-24: qty 1

## 2011-09-24 MED ORDER — POTASSIUM CHLORIDE CRYS ER 20 MEQ PO TBCR
40.0000 meq | EXTENDED_RELEASE_TABLET | Freq: Once | ORAL | Status: AC
  Administered 2011-09-24: 40 meq via ORAL
  Filled 2011-09-24 (×2): qty 2

## 2011-09-24 NOTE — Progress Notes (Signed)
Clinical social Work with Psychiatry:  Following patient for inpatient referral to [facility] due to intentional overdose with plan of suicide.  Will fax referral to [facility] for review and hopeful for bed and acceptance.  Will follow up.  Ashley Jacobs, MSW LCSW [phone]

## 2011-09-24 NOTE — Progress Notes (Signed)
[facility] is currently reviewing pt for admission.  CSW will facilitate d/c to [facility] once pt is accepted.

## 2011-09-24 NOTE — Significant Event (Signed)
Labs reviewed.  Now medically clear for [facility].   Order written   Anders Simmonds ACNP-BC [facility] Pulmonary/Critical Care Pager # [phone] OR # [phone] if no answer

## 2011-09-24 NOTE — Progress Notes (Signed)
eLink Physician-Brief Progress Note Patient Name: Cora Collum DOB: 08-Oct-1980 MRN: [id number]  Date of Service  09/24/2011   HPI/Events of Note   hypokalemia  eICU Interventions  Potassium replaced   Intervention Category Intermediate Interventions: Electrolyte abnormality - evaluation and management  Traeh Milroy 09/24/2011, 6:04 AM

## 2011-09-24 NOTE — BH Assessment (Signed)
FOLLOW UP NOTE:  DISCUSSED WITH Psych CSW  Multiple attempts made to secure a translator for [place] failed.  Psych inpatient transfer when medically stable - reviewed by Dr. Allena Katz and will re-evaluate potassium level in am.  NB Pt admits to suicide attempt and has multiple psychosocial[ethnic-cultureal] issues to resolve.

## 2011-09-24 NOTE — Progress Notes (Signed)
UR Completed.  Roniyah Llorens Jane [phone] 09/24/2011  

## 2011-09-24 NOTE — Progress Notes (Signed)
Clinical Social Work: Archivist  Following for referral to inpatient psych hospital.  Patient is not medically cleared/ready for admission to [facility] at this time per admitting psych MD: [place].  Reports potassium is too low and once more stable will re-review referral and labs.  Will follow up on 3/20.  Will notify MD and treatment team.  Call with any questions or concerns.  Ashley Jacobs, MSW LCSW [phone]

## 2011-09-24 NOTE — Progress Notes (Signed)
CRITICAL VALUE ALERT  Critical value received:  K 2.6  Date of notification:  09/24/2011  Time of notification:  0600  Critical value read back: yes  Nurse who received alert:  Barbarann Ehlers, RN  MD notified (1st page):  Deterding  Time of first page:  0605  MD notified (2nd page):  Time of second page:  Responding MD:  Deterding  Time MD responded:  0605  MD to enter order via Epic.

## 2011-09-24 NOTE — Progress Notes (Signed)
eLink Physician-Brief Progress Note Patient Name: Cora Collum DOB: 18-Jan-1981 MRN: [id number]  Date of Service  09/24/2011   HPI/Events of Note   C/o of headache  eICU Interventions  Prn tylenol   Intervention Category Minor Interventions: Routine modifications to care plan (e.g. PRN medications for pain, fever)  Davida Falconi 09/24/2011, 4:15 AM

## 2011-09-24 NOTE — Progress Notes (Signed)
CSW spoke to [place] in assessment at [facility].  Dr. Allena Katz will accept pt tomorrow following results of her BMET.  RN informed.  Will also alert psych service line CSW to the pending tx.

## 2011-09-24 NOTE — Plan of Care (Signed)
Problem: Phase I Progression Outcomes Goal: Initial discharge plan identified Outcome: Adequate for Discharge Pt to be discharged to [facility]

## 2011-09-24 NOTE — Progress Notes (Signed)
Name: Tiffany Johns MRN: [id number] DOB: [birth year]    LOS: 5  [place] Pulmonary / Critical Care Progress  Note   History of Present Illness:  31 y/o [place] female presented to [facility] ED on 3/15 after ingesting an unknown white substance at home and subsequently developing AMS.  Admitted to medical floor.  PCCM called am of 3/15 for AMS and concern for airway protection.  Tx to ICU for intubation in setting of depressed mental status.    Lines / Drains: 3/15 OETT>>>3/16  Cultures / Micro: 3/15 UA>>>neg  3/15 UDS>>>neg  3/15 lactate>>>nl   Antibiotics:  Tests / Events: 3/15 CT HEAD>>>Mild periventricular white matter hypoattenuation is nonspecific. Otherwise, no acute intracranial abnormality.  Subjective  Vital Signs: BP 94/63  Pulse 75  Temp(Src) 98.4 F (36.9 C) (Oral)  Resp 16  Ht 4\' 5"  (1.346 m)  Wt 46.5 kg (102 lb 8.2 oz)  BMI 25.66 kg/m2  SpO2 97%   I/O last 3 completed shifts: In: 1400 [P.O.:1400] Out: 1650 [Urine:1650]  Physical Examination: General: wdwn adult female with improved mental status Neuro: awake and alert CV: s1s2 tachy, regular PULM: clear GI: flat, soft, bsx4 active Extremities: warm/,dry, no edema  Labs    CBC  Lab 09/24/11 0428 09/22/11 0455 09/21/11 0600  HGB 11.8* 11.9* 13.1  HCT 34.4* 34.8* 38.0  WBC 8.1 11.8* 18.7*  PLT 184 170 165   BMET  Lab 09/24/11 0428 09/22/11 0455 09/21/11 0600 09/20/11 1153 09/19/11 2309 09/19/11 2252  NA 140 136 132* 136 142 --  K 2.6* 3.2* -- -- -- --  CL 104 109 103 104 106 --  CO2 26 15* 11* 15* -- 21  GLUCOSE 106* 86 70 96 92 --  BUN 7 5* 5* 8 17 --  CREATININE 0.43* 0.47* 0.52 0.40* 0.60 --  CALCIUM 9.1 8.7 8.9 8.0* -- 9.0  MG 1.7 -- 1.7 -- -- --  PHOS 3.4 -- 2.7 -- -- --    Assessment and Plan:  AMS in setting of reported ingestion of unknown white substance>>>AMS resolved.  Overdose attempt at suicide Assessment: CT head negative.  Reported ingestion of glass of white material from  bucket under sink.  Patient with progressive AMS. ? What substance was Plan: Monitor Psych consult>>saw 3/17 will need to be transferred to behavioral health once medically stable, should be ok later today Acute Respiratory Failure>>>resolved  Gastritis d/t ingestion,  No perforation, LFTs now normal.  Assessment: in setting of ingestion of unknown agent   Lab 09/24/11 0428 09/20/11 1153 09/19/11 2252  AST 13 22 19   ALT 10 13 15   ALKPHOS 48 52 54  BILITOT 0.9 1.0 1.1  PROT 6.7 6.8 7.2  ALBUMIN 3.2* 3.5 3.9  INR -- -- --  Plan: -PPI  Hypokalemia  Lab 09/24/11 0428 09/22/11 0455 09/21/11 0600 09/20/11 1153 09/19/11 2309  K 2.6* 3.2* 3.6 3.8 3.2*  Assessment: Plan: -replace and recheck  Leukocytosis  Lab 09/24/11 0428 09/22/11 0455 09/21/11 0600 09/20/11 1153 09/19/11 2252  WBC 8.1 11.8* 18.7* 19.5* 12.5*  Assessment: resolved  Once K is normalized (will check later) patient will be ready to D/C, if not then will need to replace more aggressively and d/c to behavioral health when K is normal.  TFR to floor with sitter

## 2011-09-25 ENCOUNTER — Telehealth (HOSPITAL_COMMUNITY): Payer: Self-pay | Admitting: *Deleted

## 2011-09-25 ENCOUNTER — Encounter (HOSPITAL_COMMUNITY): Payer: Self-pay

## 2011-09-25 ENCOUNTER — Inpatient Hospital Stay (HOSPITAL_COMMUNITY)
Admission: AD | Admit: 2011-09-25 | Discharge: 2011-09-26 | DRG: 885 | Disposition: A | Discharge status: Home / Self Care | Payer: PRIVATE HEALTH INSURANCE | Source: Ambulatory Visit | Attending: Psychiatry | Admitting: Psychiatry

## 2011-09-25 ENCOUNTER — Encounter (HOSPITAL_COMMUNITY): Payer: Self-pay | Admitting: *Deleted

## 2011-09-25 DIAGNOSIS — T50902A Poisoning by unspecified drugs, medicaments and biological substances, intentional self-harm, initial encounter: Secondary | ICD-10-CM | POA: Diagnosis present

## 2011-09-25 DIAGNOSIS — I959 Hypotension, unspecified: Secondary | ICD-10-CM

## 2011-09-25 DIAGNOSIS — T6592XA Toxic effect of unspecified substance, intentional self-harm, initial encounter: Secondary | ICD-10-CM

## 2011-09-25 DIAGNOSIS — F331 Major depressive disorder, recurrent, moderate: Secondary | ICD-10-CM

## 2011-09-25 DIAGNOSIS — F322 Major depressive disorder, single episode, severe without psychotic features: Principal | ICD-10-CM

## 2011-09-25 DIAGNOSIS — T6091XA Toxic effect of unspecified pesticide, accidental (unintentional), initial encounter: Secondary | ICD-10-CM

## 2011-09-25 DIAGNOSIS — Z560 Unemployment, unspecified: Secondary | ICD-10-CM

## 2011-09-25 LAB — BASIC METABOLIC PANEL
CO2: 23 mEq/L (ref 19–32)
Calcium: 9.2 mg/dL (ref 8.4–10.5)
Creatinine, Ser: 0.38 mg/dL — ABNORMAL LOW (ref 0.50–1.10)
Glucose, Bld: 103 mg/dL — ABNORMAL HIGH (ref 70–99)

## 2011-09-25 MED ORDER — MAGNESIUM HYDROXIDE 400 MG/5ML PO SUSP: 30.0000 mL | Freq: Every day | ORAL | Status: DC | PRN

## 2011-09-25 MED ORDER — POTASSIUM CHLORIDE CRYS ER 20 MEQ PO TBCR
40.0000 meq | EXTENDED_RELEASE_TABLET | Freq: Once | ORAL | Status: AC
  Administered 2011-09-25: 40 meq via ORAL
  Filled 2011-09-25: qty 2
  Filled 2011-09-25 (×2): qty 1

## 2011-09-25 MED ORDER — HYDROXYZINE HCL 25 MG PO TABS: 25.0000 mg | ORAL_TABLET | Freq: Every evening | ORAL | Status: DC | PRN

## 2011-09-25 MED ORDER — ALUM & MAG HYDROXIDE-SIMETH 200-200-20 MG/5ML PO SUSP: 30.0000 mL | ORAL | Status: DC | PRN

## 2011-09-25 MED ORDER — PANTOPRAZOLE SODIUM 40 MG PO TBEC
40.0000 mg | DELAYED_RELEASE_TABLET | Freq: Two times a day (BID) | ORAL | Status: DC
  Administered 2011-09-26 (×2): 40 mg via ORAL
  Filled 2011-09-25 (×6): qty 1

## 2011-09-25 MED ORDER — ACETAMINOPHEN 325 MG PO TABS
650.0000 mg | ORAL_TABLET | Freq: Four times a day (QID) | ORAL | Status: DC | PRN
Start: 2011-09-25 — End: 2011-09-27

## 2011-09-25 NOTE — BH Assessment (Signed)
Assessment Note   Tiffany Johns is an 31 y.o. female. Pt OD on white powder at home with intent to die. Brought to ED by husband and hospitalized for medical consequences of OD. Pt remains depressed, little spontaneous thought or actions, claims she will do what husband wants now. Remains at risk of another attempt due to little support or understanding of depression and responsibilities/stressors have not changed.  Pt wanted to work outside the home due to being overwhelmed with care of her children and husband's disabled parents. There was an argument with husband and husband did not speak to her for 3 days and pt decided she would die and took poison. Husband who was in home found her and sought treatment. Pt is from [place] and has language barrier but with interpreter and husband not present was able to discuss her issues with Prince Rome MD and inpatient hospitalization recommended.  Axis I: Mood Disorder NOS Axis II: Deferred Axis III:  Past Medical History  Diagnosis Date  . Hypotension    Axis IV: other psychosocial or environmental problems and problems with primary support group Axis V: 21-30 behavior considerably influenced by delusions or hallucinations OR serious impairment in judgment, communication OR inability to function in almost all areas  Past Medical History:  Past Medical History  Diagnosis Date  . Hypotension     Past Surgical History  Procedure Date  . Cesarean section x2    Family History:  Family History  Problem Relation Age of Onset  . Anesthesia problems Neg Hx   . Hypotension Neg Hx   . Malignant hyperthermia Neg Hx   . Pseudochol deficiency Neg Hx     Social History:  reports that she has never smoked. She does not have any smokeless tobacco history on file. She reports that she does not drink alcohol or use illicit drugs.  Additional Social History:  Alcohol / Drug Use Pain Medications: not abusing Prescriptions: not abusing Over the Counter:  nos History of alcohol / drug use?: No history of alcohol / drug abuse Allergies: No Known Allergies  Home Medications:  Medications Prior to Admission  Medication Dose Route Frequency Provider Last Rate Last Dose  . acetaminophen (TYLENOL) tablet 650 mg  650 mg Oral Q6H PRN Viviann Spare, NP      . alum & mag hydroxide-simeth (MAALOX/MYLANTA) 200-200-20 MG/5ML suspension 30 mL  30 mL Oral Q4H PRN Viviann Spare, NP      . hydrOXYzine (ATARAX/VISTARIL) tablet 25 mg  25 mg Oral QHS PRN Viviann Spare, NP      . magnesium hydroxide (MILK OF MAGNESIA) suspension 30 mL  30 mL Oral Daily PRN Viviann Spare, NP      . pantoprazole (PROTONIX) EC tablet 40 mg  40 mg Oral BID AC Viviann Spare, NP      . potassium chloride SA (K-DUR,KLOR-CON) CR tablet 40 mEq  40 mEq Oral Once Storm Frisk, MD   40 mEq at 09/24/11 2036  . potassium chloride SA (K-DUR,KLOR-CON) CR tablet 40 mEq  40 mEq Oral Q1 Hr x 2 Zigmund Gottron, MD   40 mEq at 09/24/11 0741  . potassium chloride SA (K-DUR,KLOR-CON) CR tablet 40 mEq  40 mEq Oral Once Simonne Martinet, NP   40 mEq at 09/24/11 1032  . potassium chloride SA (K-DUR,KLOR-CON) CR tablet 40 mEq  40 mEq Oral Once Viviann Spare, NP      . DISCONTD: acetaminophen (TYLENOL) tablet 650  mg  650 mg Oral Q6H PRN Zigmund Gottron, MD   650 mg at 09/24/11 2036  . DISCONTD: pantoprazole (PROTONIX) EC tablet 40 mg  40 mg Oral BID AC Storm Frisk, MD   40 mg at 09/25/11 3086   No current outpatient prescriptions on file as of 09/25/2011.    OB/GYN Status:  Patient's last menstrual period was 09/08/2011.  General Assessment Data Location of Assessment: [facility] Assessment Services Living Arrangements: Spouse/significant other;Family members (husband, children, disabled inlaws) Can pt return to current living arrangement?: Yes Admission Status: Voluntary Is patient capable of signing voluntary admission?: Yes Transfer from: Acute Hospital Referral  Source: MD  Education Status Is patient currently in school?: No Contact person: Mahlon Gammon [phone])  Risk to self Suicidal Ideation: Yes-Currently Present Suicidal Intent: Yes-Currently Present Is patient at risk for suicide?: Yes Suicidal Plan?: Yes-Currently Present Specify Current Suicidal Plan: OD on poison Access to Means: Yes Specify Access to Suicidal Means: Od on white powder at home What has been your use of drugs/alcohol within the last 12 months?: none Previous Attempts/Gestures: No How many times?: 0  Intentional Self Injurious Behavior: None Family Suicide History: Unknown Recent stressful life event(s): Conflict (Comment) (argument with husband) Persecutory voices/beliefs?: No Depression: Yes Depression Symptoms: Despondent;Insomnia;Tearfulness;Guilt;Loss of interest in usual pleasures;Feeling worthless/self pity Substance abuse history and/or treatment for substance abuse?: No Suicide prevention information given to non-admitted patients: Not applicable  Risk to Others Homicidal Ideation: No Thoughts of Harm to Others: No Current Homicidal Intent: No Current Homicidal Plan: No Access to Homicidal Means: No History of harm to others?: No Assessment of Violence: None Noted Does patient have access to weapons?: No Criminal Charges Pending?: No Does patient have a court date: No  Psychosis Hallucinations: None noted Delusions: None noted  Mental Status Report Appear/Hygiene: Other (Comment) (in hospital) Eye Contact: Fair Motor Activity: Psychomotor retardation Speech: Soft;Slow Level of Consciousness: Quiet/awake Mood: Depressed Affect: Depressed Anxiety Level: None Thought Processes: Coherent;Relevant Judgement: Impaired Orientation: Person;Place;Time;Situation Obsessive Compulsive Thoughts/Behaviors: None  Cognitive Functioning Concentration: Decreased Memory: Recent Intact;Remote Intact IQ: Average Insight: Poor Impulse Control:  Poor Appetite: Fair Weight Loss: 0  Weight Gain: 0  Sleep: Decreased Total Hours of Sleep: 6  Vegetative Symptoms: None  Prior Inpatient Therapy Prior Inpatient Therapy: No  Prior Outpatient Therapy Prior Outpatient Therapy: No  ADL Screening (condition at time of admission) Patient's cognitive ability adequate to safely complete daily activities?: Yes Patient able to express need for assistance with ADLs?: Yes Independently performs ADLs?: Yes Communication: Independent Dressing (OT): Independent Grooming: Independent Is this a change from baseline?: Pre-admission baseline Feeding: Independent Is this a change from baseline?: Pre-admission baseline Bathing: Independent Is this a change from baseline?: Pre-admission baseline Toileting: Independent Is this a change from baseline?: Pre-admission baseline In/Out Bed: Independent Is this a change from baseline?: Pre-admission baseline Walks in Home: Independent Weakness of Legs: None Weakness of Arms/Hands: None  Home Assistive Devices/Equipment Home Assistive Devices/Equipment: None          Advance Directives (For Healthcare) Advance Directive: Patient does not have advance directive Pre-existing out of facility DNR order (yellow form or pink MOST form): No Nutrition Screen Diet: Regular Unintentional weight loss greater than 10lbs within the last month: No Problems chewing or swallowing foods and/or liquids: No Home Tube Feeding or Total Parenteral Nutrition (TPN): No Patient appears severely malnourished: No Pregnant or Lactating: No  Additional Information 1:1 In Past 12 Months?: Yes CIRT Risk: No Elopement Risk: No Does  patient have medical clearance?: Yes     Disposition:  Disposition Disposition of Patient: Inpatient treatment program Type of inpatient treatment program: Adult  On Site Evaluation by:   Reviewed with Physician:     Conan Bowens 09/25/2011 7:37 PM

## 2011-09-25 NOTE — Progress Notes (Signed)
Clinical Social Work with Psychiatry  Re-sent referral with updated lab work and MD orders for medical clearance.  Anticipate a bed this afternoon and patient being admitted.  Will follow up and complete discharge once bed available.    Ashley Jacobs, MSW LCSW [phone]

## 2011-09-25 NOTE — Tx Team (Signed)
Initial Interdisciplinary Treatment Plan  PATIENT STRENGTHS: (choose at least two) Ability for insight Capable of independent living Motivation for treatment/growth Physical Health Supportive family/friends Work skills  PATIENT STRESSORS: Financial difficulties Marital or family conflict Occupational concerns   PROBLEM LIST: Problem List/Patient Goals Date to be addressed Date deferred Reason deferred Estimated date of resolution  Depression      SI                                                 DISCHARGE CRITERIA:  Ability to meet basic life and health needs Adequate post-discharge living arrangements Improved stabilization in mood, thinking, and/or behavior Medical problems require only outpatient monitoring Motivation to continue treatment in a less acute level of care Need for constant or close observation no longer present Reduction of life-threatening or endangering symptoms to within safe limits Safe-care adequate arrangements made Verbal commitment to aftercare and medication compliance  PRELIMINARY DISCHARGE PLAN: Outpatient therapy  PATIENT/FAMIILY INVOLVEMENT: This treatment plan has been presented to and reviewed with the patient, Tiffany Johns, and/or family member, Tiffany Johns, spouse with assistance from [employer], interpretor [id number].  The patient and family have been given the opportunity to ask questions and make suggestions.  Arturo Morton 09/25/2011, 8:13 PM

## 2011-09-25 NOTE — Progress Notes (Signed)
31 yo female native of [place] (speaks minimal [place]) who presents voluntarily and in no acute distress for the treatment of Depression and SI. Assessed with assist from [facility], Interpretor [phone]. Appears flat and depressed. Somewhat fidgety during assessment. States she attempted to overdose on "cockroach medicine" after an argument with her spouse. States the argument was about his demand that she stay home to care for the children and In-Laws. She wants to keep working because she has friends that work with her that she likes to spend time with them too. States he has never yelled at her in the past and she was upset about it and impulsively took the "medicine". She was  Brought into ED, intubated and spend 2 days on CCU. States she regrets taking the "medicine" and is eager to return home to care for her family. Denies SI currently and contracts for safety. No HI/AVH Health history of 2 C-sections. Non-smoker who denies any substance abuse. Skin assessed by Santina Evans, RN found to be clear apart from old, well healed surgical scars. Unit policies and expectations reviewed via translator. Consents obtained. POC reviewed and understanding verbalized. Escorted to room and oriented by Gwenyth Ober. Q15 minute safety precautions initiated at 1915. Patient has no belongings locked up. Emergency Contact; Noreene Filbert (spouse) [phone]

## 2011-09-25 NOTE — Progress Notes (Addendum)
Nutrition Follow-Up  Diet advanced to Regular on 3/17. Weight is 103 lbs (46.9 kg), increased 3 lbs from RD note on 3/18. Magnesium and phosphorus levels are within normal limits. CBG's >115.   Per previous RD note on 3/18, appetite and meal intake was minimal, approximately 25%. RN reported patient has been able to consume about 50% of meals, largely related to cultural preferences. Has had family bring in food, and was able to consume 75% of meal. Patient reported appetite is improving. Would not like any type of snack or supplement.   Hypokalemia and ARF has resolved. Discharge to [facility] is anticipated for this afternoon per MD note.   Nutrition Dx: Inadequate oral intake (NI-2.1). Status: Progressing Goal: Consume >/= 90% of estimated needs, progressing   Intervention:  Due to patient's refusal of additional supplements, weight stabilized, and PO intake at 75%, no additional nutrition interventions are appropriate at this time  Please contact RD if additional nutrition assistance is requested     Kendell Bane Cornelison [phone]

## 2011-09-25 NOTE — Discharge Summary (Signed)
Pt d/c from 3000 and moved to [facility]. Report called to [facility]. IV removed. Elmer Sow, RN

## 2011-09-26 DIAGNOSIS — T50902A Poisoning by unspecified drugs, medicaments and biological substances, intentional self-harm, initial encounter: Secondary | ICD-10-CM | POA: Diagnosis present

## 2011-09-26 DIAGNOSIS — F331 Major depressive disorder, recurrent, moderate: Secondary | ICD-10-CM

## 2011-09-26 MED ORDER — CITALOPRAM HYDROBROMIDE 20 MG PO TABS
10.0000 mg | ORAL_TABLET | Freq: Every day | ORAL | Status: DC
  Administered 2011-09-26: 10 mg via ORAL
  Filled 2011-09-26: qty 0.5
  Filled 2011-09-26 (×3): qty 1

## 2011-09-26 NOTE — Progress Notes (Signed)
Patient ID: Tiffany Johns, female   DOB: Apr 17, 1981, 31 y.o.   MRN: [id number] Pt. Pleasant, husband and interpreter at bedside. Pt. Under the impression that she is to be discharged tonight. Denies SHI, reports depression at "1".  Writer to call Dr. Dan Humphreys to inquire about discharge. Staff will continue to monitor q70min for safety. Pt. Remains safe on the unit.

## 2011-09-26 NOTE — Progress Notes (Signed)
[facility] Group Notes: (Counselor/Nursing/MHT/Case Management/Adjunct) 09/26/2011   @  11:00am   Type of Therapy:  Group Therapy  Participation Level:  Minimal  Participation Quality:   Attentive  Affect:  Blunted  Cognitive:  Unknown  Insight:  Unknown  Engagement in Group: Minimal  Engagement in Therapy:  Minimal  Modes of Intervention:  Support and Exploration  Summary of Progress/Problems: Tiffany Johns came to group and seemed attentive, but left early as there was not interpretor present for her.   [name] 09/26/2011 12:43 PM

## 2011-09-26 NOTE — Progress Notes (Signed)
Patient ID: Tiffany Johns, female   DOB: 12-01-1980, 31 y.o.   MRN: [id number] Discharge completed, writer reviewed discharge information with patient, interpreter(Durga Idelle Leech) and husband in during discharge. Pt. And husband verbalized understanding. Husband only concern was if calling police was a bad thing, Clinical research associate explained in [place] was a good thing to call in case of emergency/suicidal risk for help. Writer also identified a Radio broadcast assistant) that the pt. Identified as Lindell Noe, the priest.  And husband will use in case of argument/disagreement. Barista verified with pt. No clothes in locker also checked locker room. Pt. Denies SHI. Pt. Discharged with husband, with priest and interpreter at side.

## 2011-09-26 NOTE — Discharge Instructions (Signed)
Please make plans to have someone that both of you trust mediate between you and your husband the next time there is a "silent argument" and BEFORE you take any rash moves to harm anyone.

## 2011-09-26 NOTE — H&P (Signed)
Medical/psychiatric screening examination/treatment/procedure(s) were performed by non-physician practitioner and as supervising physician I was immediately available for consultation/collaboration.   I have seen and examined this patient and agree with this evaluation.  

## 2011-09-26 NOTE — H&P (Signed)
Psychiatric Admission Assessment Adult  Patient Identification:  Tiffany Johns  Date of Evaluation:  09/26/2011  Chief Complaint:  MDD, Single Episode, Severe  History of Present Illness:: This assessment was conducted using a [place] language interpreter [name] from the language resource. This is a 31 year old [place] female, admitted to [facility] with complaints of suicide attempt by ingesting a pesticide. Patient reports, "I am in this hospital because I swallowed a medicine used to kill roaches after having a misunderstanding with my husband. I had a job in the past, but was laid off. However, I was called back to start my job again. I was happy and excited to go back to my job and also see my friends again. But my husband said I should not go back to work, rather to stay home and take care of my children and his elderly sick parents. I did not like the idea of staying at home. I rather work outside the home because I want to make some money and establish some credit for me. After we had the argument, I got mad. I was not thinking straight, so I took the pesticide to kill myself. My husband realized what I had done and brought me to the hospital. Unfortunately, I did not make the right decision. I regretted my action and I am ashamed of what I had done. This is my first time to do this, it is unlike me. I am happy with my husband and my family. I love my family and I will not do this kind of thing again. I need to go home because, no one is caring for my children right now. I am not depressed"  Mood Symptoms:  Guilt, Sadness,  Depression Symptoms:  feelings of worthlessness/guilt, suicidal thoughts with specific plan, suicidal attempt,  (Hypo) Manic Symptoms:  Impulsivity, Irritable Mood,  Anxiety Symptoms:  Excessive Worry,  Psychotic Symptoms:  Hallucinations: None  PTSD Symptoms: Had a traumatic exposure:  "This experience is traumatic for me"  Past Psychiatric History: Diagnosis:  Suicide attempt by ingestion of a pesticide.  Hospitalizations: [facility]  Outpatient Care: "I don't have one"  Substance Abuse Care: Denies  Self-Mutilation: Denies  Suicidal Attempts: "Yes, by ingestion of pesticide"  Violent Behaviors: Denies   Past Medical History:   Past Medical History  Diagnosis Date  . Hypotension    Cardiac History:  Hypotension  Allergies:  No Known Allergies  PTA Medications: No prescriptions prior to admission    Previous Psychotropic Medications:  Medication/Dose  None reported               Substance Abuse History in the last 12 months: Substance Age of 1st Use Last Use Amount Specific Type  Nicotine I don't smoke, drink alcohol and or use any illegal drugs"     Alcohol      Cannabis      Opiates      Cocaine      Methamphetamines      LSD      Ecstasy      Benzodiazepines      Caffeine      Inhalants      Others:                         Consequences of Substance Abuse: Medical Consequences:  Liver damage Legal Consequences:  Arrests, jail time Family Consequences:  Family discord Withdrawal Symptoms:   None  Social History: Current Place of Residence:  [place] of Birth:  [place], in [place]  Family Members: "My husband and 2 children"   Marital Status:  Married  Children: 2  Sons: 0  Daughters: 2  Relationships: "With my husband"  Education:  "I stopped at 5th grade"  Educational Problems/Performance: "I did not complete high school"  Religious Beliefs/Practices: None reported  History of Abuse (Emotional/Phsycial/Sexual): Denies   Occupational Experiences: English as a second language teacher History:  None.  Legal History: None reported  Hobbies/Interests: None reported  Family History:   Family History  Problem Relation Age of Onset  . Anesthesia problems Neg Hx   . Hypotension Neg Hx   . Malignant hyperthermia Neg Hx   . Pseudochol deficiency Neg Hx     Mental Status  Examination/Evaluation: Objective:  Appearance: Casual and Neat  Eye Contact::  Good  Speech:  Clear and Coherent, however, unable to speak english  Volume:  Normal  Mood:  Euthymic per patient's report  Affect:  Flat  Thought Process:  Coherent  Orientation:  Full  Thought Content:  Rumination  Suicidal Thoughts:  No  Homicidal Thoughts:  No  Memory:  Immediate;   Good Recent;   Good Remote;   Good  Judgement:  Poor  Insight:  Lacking  Psychomotor Activity:  Normal  Concentration:  Good  Recall:  Good  Akathisia:  No  Handed:  Right  AIMS (if indicated):     Assets:  Desire for Improvement  Sleep:  Number of Hours: 4.5     Laboratory/X-Ray: None Psychological Evaluation(s)      Assessment:    AXIS I:  Suicide attempt by ingestion of pesticide AXIS II:  Deferred AXIS III:   Past Medical History  Diagnosis Date  . Hypotension    AXIS IV:  economic problems, educational problems, occupational problems, other psychosocial or environmental problems and Familial stressors AXIS V:  51-60 moderate symptoms  Treatment Plan/Recommendations: Admit for safety and stabilization.                                                                Review and reinstate any pertinent home medications.                                                                Citalopram 10 mg daily.    Treatment Plan Summary: Daily contact with patient to assess and evaluate symptoms and progress in treatment Medication management   Current Medications:  Current Facility-Administered Medications  Medication Dose Route Frequency Provider Last Rate Last Dose  . acetaminophen (TYLENOL) tablet 650 mg  650 mg Oral Q6H PRN Viviann Spare, NP      . alum & mag hydroxide-simeth (MAALOX/MYLANTA) 200-200-20 MG/5ML suspension 30 mL  30 mL Oral Q4H PRN Viviann Spare, NP      . hydrOXYzine (ATARAX/VISTARIL) tablet 25 mg  25 mg Oral QHS PRN Viviann Spare, NP      . magnesium hydroxide (MILK OF  MAGNESIA) suspension 30 mL  30 mL Oral Daily PRN Viviann Spare, NP      .  pantoprazole (PROTONIX) EC tablet 40 mg  40 mg Oral BID AC Viviann Spare, NP   40 mg at 09/26/11 1610  . potassium chloride SA (K-DUR,KLOR-CON) CR tablet 40 mEq  40 mEq Oral Once Viviann Spare, NP   40 mEq at 09/25/11 2221   Facility-Administered Medications Ordered in Other Encounters  Medication Dose Route Frequency Provider Last Rate Last Dose  . DISCONTD: acetaminophen (TYLENOL) tablet 650 mg  650 mg Oral Q6H PRN Zigmund Gottron, MD   650 mg at 09/24/11 2036  . DISCONTD: pantoprazole (PROTONIX) EC tablet 40 mg  40 mg Oral BID AC Storm Frisk, MD   40 mg at 09/25/11 9604    Observation Level/Precautions:  Q 15 minutes checks for safety.  Laboratory:  Reviewed and noted ED lab findings on file  Psychotherapy: Group   Medications:  See lists  Routine PRN Medications:  Yes  Consultations: None indicated   Discharge Concerns:  Safety  Other:     Sanjuana Kava 3/21/20133:17 PM

## 2011-09-26 NOTE — Progress Notes (Signed)
Tiffany Johns reports that while she understands some [place], she has trouble communicating and needs an interpreter who speaks [place]. Counselor relayed this information to CM secretary who is setting up and interpreter.  [name] 09/26/2011  9:35 AM

## 2011-09-26 NOTE — BHH Counselor (Signed)
Adult Comprehensive Assessment  Patient ID: Tiffany Johns, female   DOB: 03-16-1981, 31 y.o.   MRN: [id number]  Information Source: Information source: Interpreter  Current Stressors:  Educational / Learning stressors: cannot read - only went to 5th grade Employment / Job issues: wants to keep working but husband wants her to stay at home Family Relationships: conflict with husband Surveyor, quantity / Lack of resources (include bankruptcy): wants to make her own income and establish her own credit Housing / Lack of housing: no stressors reported Physical health (include injuries & life threatening diseases): no stressors reported Social relationships: has friends - wants to go back to work with her friends Substance abuse: no stressors reported Bereavement / Loss: thought of loss of friends, loss of job  Living/Environment/Situation:  How long has patient lived in current situation?: years What is atmosphere in current home: Chaotic  Family History:  Marital status: Married Number of Years Married: 15  What types of issues is patient dealing with in the relationship?: husband wants her to stay home and care for his elderly parents Does patient have children?: Yes How many children?: 2  How is patient's relationship with their children?: 12 and 7 - daughters - great relationship  Childhood History:  By whom was/is the patient raised?: Both parents Additional childhood history information: from [place], spent 17 years in a [place] refugee camp Description of patient's relationship with caregiver when they were a child: good Patient's description of current relationship with people who raised him/her: okay not much contact Does patient have siblings?:  (unclear) Did patient suffer any verbal/emotional/physical/sexual abuse as a child?: No Did patient suffer from severe childhood neglect?: No Has patient ever been sexually abused/assaulted/raped as an adolescent or adult?: No Was the patient  ever a victim of a crime or a disaster?: Yes Patient description of being a victim of a crime or disaster: witnessed war in [place]  Witnessed domestic violence?: No Has patient been effected by domestic violence as an adult?: No  Education:  Highest grade of school patient has completed: 5th Currently a Consulting civil engineer?: No Learning disability?: Yes What learning problems does patient have?: cannot read [place] or Nepali  Employment/Work Situation:   Employment situation: Employed Where is patient currently employed?: unclear How long has patient been employed?: was employed then laid off and has now been called back Patient's job has been impacted by current illness: No What is the longest time patient has a held a job?: unclear Where was the patient employed at that time?: unclear Has patient ever been in the [employer]?: No Has patient ever served in Buyer, retail?: No  Financial Resources:   Financial resources: Income from employment;Income from spouse Does patient have a representative payee or guardian?: No  Alcohol/Substance Abuse:   What has been your use of drugs/alcohol within the last 12 months?: no substance use reported If attempted suicide, did drugs/alcohol play a role in this?: No (overdosed on cockroach "medicine", possibly poison?) Alcohol/Substance Abuse Treatment Hx: Denies past history Has alcohol/substance abuse ever caused legal problems?: No  Social Support System:   Conservation officer, nature Support System: Good Describe Community Support System: husband, in-laws, firends Type of faith/religion: Bhuddist How does patient's faith help to cope with current illness?: has faith in things getting better  Leisure/Recreation:   Leisure and Hobbies: likes to work and be with family  Strengths/Needs:   What things does the patient do well?: good worker, good mother, wants to be independent In what areas does patient struggle / problems  for patient: conflict with husband over her  working outside of the home, overwhelmed by taking care of kids and disabled inlaws  Discharge Plan:   Does patient have access to transportation?: Yes Will patient be returning to same living situation after discharge?: Yes Currently receiving community mental health services: No If no, would patient like referral for services when discharged?: No Does patient have financial barriers related to discharge medications?: No  Summary/Recommendations:   Summary and Recommendations (to be completed by the evaluator): Tiffany Johns is a 31 year old married woman who speaks Nepali and requires a Nurse, learning disability. She recently had a suicide attempt which she currently regrets. Describes her relationship with husband and kids as good and no history of violence. Reports she was called back to her job and wanted to return because she wants to see her friends there, but husband wants her to stay home with his parents. Reports she responded badly by taking the cockroach poison because she was not used to him being angry with her - thinks she has a second chance now and wants to live. [name] would beneift from crisis stabilizaiton, medication evaluation, therapy groups for processing thoughts/feelings/experiences, psychoed groups for coping skills, and case mangement for discharge planning.   Lyn Hollingshead, Lyndee Hensen. 09/26/2011

## 2011-09-26 NOTE — [facility] Suicide Risk Assessment (Addendum)
Suicide Risk Assessment  Discharge Assessment     Demographic factors:  Low socioeconomic status    Current Mental Status Per Nursing Assessment::   On Admission:   (Denies currently) At Discharge:     Current Mental Status Per Physician:  Loss Factors: Decrease in vocational status (Marital discord)  Historical Factors: Impulsivity  Risk Reduction Factors:      Continued Clinical Symptoms:  Medical Diagnoses and Treatments/Surgeries  Discharge Diagnoses:   AXIS I:  Adjustment Disorder with Disturbance of Conduct AXIS II:  Deferred AXIS III:   Past Medical History  Diagnosis Date  . Hypotension    AXIS IV:  other psychosocial or environmental problems AXIS V:  51-60 moderate symptoms  Cognitive Features That Contribute To Risk:  Thought constriction (tunnel vision)    Suicide Risk:  Minimal: No identifiable suicidal ideation.  Patients presenting with no risk factors but with morbid ruminations; may be classified as minimal risk based on the severity of the depressive symptoms  ADL's:  Intact  Sleep: Good  Appetite:  Good  Suicidal Ideation:  Denies adamantly any suicidal thoughts. Homicidal Ideation:  Denies adamantly any homicidal thoughts.  Mental Status Examination/Evaluation: Objective:  Appearance: Casual  Eye Contact::  Good  Speech:  Clear and Coherent, but unable to speak English (all communications were through an interpretor   Volume:  Normal  Mood:  Euthymic  Affect:  Congruent  Thought Process:  Coherent  Orientation:  Full  Thought Content:  WDL  Suicidal Thoughts:  No  Homicidal Thoughts:  No  Memory:  Immediate;   Good  Judgement:  Good  Insight:  Good  Psychomotor Activity:  Normal  Concentration:  Good  Recall:  Good  Akathisia:  No  AIMS (if indicated):     Assets:  Communication Skills Desire for Improvement Financial Resources/Insurance Housing Intimacy Leisure Time Physical Health Resilience Social  Support Talents/Skills Transportation  Sleep: Number of Hours: 4.5    Vital Signs: Blood pressure 103/71, pulse 104, temperature 97.9 F (36.6 C), temperature source Oral, resp. rate 16, height 4\' 10"  (1.473 m), weight 47.174 kg (104 lb), last menstrual period 09/08/2011.  Labs Results for orders placed during the hospital encounter of 09/19/11 (from the past 72 hour(s))  CBC     Status: Abnormal   Collection Time   09/24/11  4:28 AM      Component Value Range Comment   WBC 8.1  4.0 - 10.5 (K/uL)    RBC 4.16  3.87 - 5.11 (MIL/uL)    Hemoglobin 11.8 (*) 12.0 - 15.0 (g/dL)    HCT 16.1 (*) 09.6 - 46.0 (%)    MCV 82.7  78.0 - 100.0 (fL)    MCH 28.4  26.0 - 34.0 (pg)    MCHC 34.3  30.0 - 36.0 (g/dL)    RDW 04.5  40.9 - 81.1 (%)    Platelets 184  150 - 400 (K/uL)   BASIC METABOLIC PANEL     Status: Abnormal   Collection Time   09/24/11  4:28 AM      Component Value Range Comment   Sodium 140  135 - 145 (mEq/L)    Potassium 2.6 (*) 3.5 - 5.1 (mEq/L)    Chloride 104  96 - 112 (mEq/L)    CO2 26  19 - 32 (mEq/L)    Glucose, Bld 106 (*) 70 - 99 (mg/dL)    BUN 7  6 - 23 (mg/dL)    Creatinine, Ser 9.14 (*) 0.50 - 1.10 (  mg/dL)    Calcium 9.1  8.4 - 10.5 (mg/dL)    GFR calc non Af Amer >90  >90 (mL/min)    GFR calc Af Amer >90  >90 (mL/min)   MAGNESIUM     Status: Normal   Collection Time   09/24/11  4:28 AM      Component Value Range Comment   Magnesium 1.7  1.5 - 2.5 (mg/dL)   PHOSPHORUS     Status: Normal   Collection Time   09/24/11  4:28 AM      Component Value Range Comment   Phosphorus 3.4  2.3 - 4.6 (mg/dL)   HEPATIC FUNCTION PANEL     Status: Abnormal   Collection Time   09/24/11  4:28 AM      Component Value Range Comment   Total Protein 6.7  6.0 - 8.3 (g/dL)    Albumin 3.2 (*) 3.5 - 5.2 (g/dL)    AST 13  0 - 37 (U/L)    ALT 10  0 - 35 (U/L)    Alkaline Phosphatase 48  39 - 117 (U/L)    Total Bilirubin 0.9  0.3 - 1.2 (mg/dL)    Bilirubin, Direct 0.2  0.0 - 0.3 (mg/dL)     Indirect Bilirubin 0.7  0.3 - 0.9 (mg/dL)   BASIC METABOLIC PANEL     Status: Abnormal   Collection Time   09/24/11  2:00 PM      Component Value Range Comment   Sodium 140  135 - 145 (mEq/L)    Potassium 4.3  3.5 - 5.1 (mEq/L)    Chloride 108  96 - 112 (mEq/L)    CO2 26  19 - 32 (mEq/L)    Glucose, Bld 113 (*) 70 - 99 (mg/dL)    BUN 7  6 - 23 (mg/dL)    Creatinine, Ser 1.61 (*) 0.50 - 1.10 (mg/dL)    Calcium 9.1  8.4 - 10.5 (mg/dL)    GFR calc non Af Amer >90  >90 (mL/min)    GFR calc Af Amer >90  >90 (mL/min)   BASIC METABOLIC PANEL     Status: Abnormal   Collection Time   09/24/11  6:31 PM      Component Value Range Comment   Sodium 137  135 - 145 (mEq/L)    Potassium 4.0  3.5 - 5.1 (mEq/L)    Chloride 104  96 - 112 (mEq/L)    CO2 25  19 - 32 (mEq/L)    Glucose, Bld 115 (*) 70 - 99 (mg/dL)    BUN 7  6 - 23 (mg/dL)    Creatinine, Ser 0.96 (*) 0.50 - 1.10 (mg/dL)    Calcium 9.4  8.4 - 10.5 (mg/dL)    GFR calc non Af Amer >90  >90 (mL/min)    GFR calc Af Amer >90  >90 (mL/min)   CBC     Status: Abnormal   Collection Time   09/24/11  6:31 PM      Component Value Range Comment   WBC 8.7  4.0 - 10.5 (K/uL)    RBC 4.44  3.87 - 5.11 (MIL/uL)    Hemoglobin 13.3  12.0 - 15.0 (g/dL)    HCT 04.5  40.9 - 81.1 (%)    MCV 82.2  78.0 - 100.0 (fL)    MCH 30.0  26.0 - 34.0 (pg)    MCHC 36.4 (*) 30.0 - 36.0 (g/dL)    RDW 91.4  78.2 - 95.6 (%)  Platelets 196  150 - 400 (K/uL)   BASIC METABOLIC PANEL     Status: Abnormal   Collection Time   09/25/11  7:05 AM      Component Value Range Comment   Sodium 138  135 - 145 (mEq/L)    Potassium 4.0  3.5 - 5.1 (mEq/L)    Chloride 106  96 - 112 (mEq/L)    CO2 23  19 - 32 (mEq/L)    Glucose, Bld 103 (*) 70 - 99 (mg/dL)    BUN 7  6 - 23 (mg/dL)    Creatinine, Ser 2.13 (*) 0.50 - 1.10 (mg/dL)    Calcium 9.2  8.4 - 10.5 (mg/dL)    GFR calc non Af Amer >90  >90 (mL/min)    GFR calc Af Amer >90  >90 (mL/min)     RISK REDUCTION FACTORS: What  pt has learned from hospital stay is that she has been given a second chance at life and that she must listen to her husband and not go to work right now.  Risk of self harm is elevated by this her only suicide attempt, but she sees that she has her family, her children, and herself to live for.  In an extensive interview with an interpreter, she denied any evidence of a depressed mood or an equivalent in her culture. She is a refugee from [place], where she had been living in a refugee camp for may years.  She has survived that and is making a life for herself and her family in the [place].  She plans for herself to take ESL classes and spend time reaching out to others on the computer while upholding her domestic responsibilities.  She plans to go to work when her husband might get laid-off or when other needs change in the family.  She feels that she can communicate better with her husband in the future and that she promises herself and everyone that she has learned her lesson and she will never do this again.  Risk of harm to others is minimal in that she has not been involved in fights or had any legal charges filed on her.  PLAN: Discharge home Continue Medication List    Notice       You have not been prescribed any medications.            Follow-up recommendations:  Activities: Resume typical activities Diet: Resume typical diet Other: Follow up with outpatient provider and report any side effects to out patient prescriber.  No Follow-up on file.  Patient was given information about how to access Domestic Violence Shelters.  She was informed that she can access that by simply calling 911.  She seemed to grasp that concept and felt like she could reach out in that way if she felt in danger.    Dusty Wagoner 09/26/2011, 7:59 PM

## 2011-09-26 NOTE — Progress Notes (Signed)
Pt attended discharge planning group and minimally participated.  Pt did not share or speak in group due to the language barrier.  SW will wait until interpreter is here to speak with pt.    Tiffany Johns, [place] 09/26/2011  12:38 PM

## 2011-09-26 NOTE — Progress Notes (Signed)
Pt is a little withdrawn but rates depression and hopelessness at a 1. Pt does attend groups. Pt was offered support and encouragement. Pt receptive to treatment and safety maintained on unit.

## 2011-09-26 NOTE — Progress Notes (Signed)
[facility] Adult Inpatient Family/Significant Other Suicide Prevention Education  Suicide Prevention Education:  Contact Attempts:Prem Darjee, husband, has been identified by the patient as the family member/significant other with whom the patient will be residing, and identified as the person(s) who will aid the patient in the event of a mental health crisis.  With written consent from the patient, attempts were made to provide suicide prevention education, prior to and/or following the patient's discharge.  We were unsuccessful in providing suicide prevention education.  A suicide education pamphlet was given to the patient to share with family/significant other.  Date and time of first attempt:09/26/11 @ 2:25pm Date and time of second attempt:09/26/11 @ 4:19pm  Richelle Ito reports that her husband cannot answer the phone at work and will not get off work until later this evening, after counseling staff has left for the night. Counselor found suicide prevention information written in [place] and provided it to Helper. She reported that she cannot read, but that her husband can and she will give it to him. Dr. Dan Humphreys is requesting that the nurse who discharges Richelle Ito go over suicide prevention information with her and her husband tonight.  The interpreter, Despina Arias, agrees to be present for that at 7pm.    Lyn Hollingshead, Lyndee Hensen 09/26/2011, 4:18 PM

## 2011-09-26 NOTE — BHH Suicide Risk Assessment (Signed)
Suicide Risk Assessment  Admission Assessment     See Discharge Suicide Risk Assessment   Nickia Boesen 09/26/2011, 8:25 PM

## 2011-09-26 NOTE — Progress Notes (Signed)
Patient ID: Tiffany Johns, female   DOB: 01-Mar-1981, 32 y.o.   MRN: [id number] Pt. On phone most of the evening, denies pain and SHI. Takes meds without incident. Staff will monitor q63min for safety.

## 2011-09-27 NOTE — Progress Notes (Signed)
[facility] Adult Inpatient Family/Significant Other Suicide Prevention Education  Suicide Prevention Education:  Education Completed;Prem Darjee, husband (in person) has been identified by the patient as the family member/significant other with whom the patient will be residing, and identified as the person(s) who will aid the patient in the event of a mental health crisis (suicidal ideations/suicide attempt).  With written consent from the patient, the family member/significant other has been provided the following suicide prevention education, prior to the and/or following the discharge of the patient.  The suicide prevention education provided includes the following:  Suicide risk factors  Suicide prevention and interventions  National Suicide Hotline telephone number  [facility] assessment telephone number  [facility] Emergency Assistance 911  [facility] and/or Residential Mobile Crisis Unit telephone number  Request made of family/significant other to:  Remove weapons (e.g., guns, rifles, knives), all items previously/currently identified as safety concern.    Remove drugs/medications (over-the-counter, prescriptions, illicit drugs), all items previously/currently identified as a safety concern.   Pt. And husband verbalized understanding of suicide prevention information. Husband's only concern was if calling police was a bad thing, nurse explained in [place] was a good thing to call in case of emergency/suicidal risk for help. Nurse also identified a contact person(mediator) that the pt. Identified as Lindell Noe, the priest; patient and husband will use in case of argument/disagreement. Husband confirmed there are no weapons in the home and he has no safety concerns about pt being discharged.    Facilitated by Vidal Schwalbe, RN on 09/26/11 @ 9:30pm Cosigned by: Angus Palms, LCSW on 09/27/2011 8:13 AM

## 2011-09-30 ENCOUNTER — Encounter (HOSPITAL_COMMUNITY): Payer: Self-pay | Admitting: Internal Medicine

## 2011-10-01 NOTE — Progress Notes (Signed)
Patient Discharge Instructions:  No follow up.  Wandra Scot, 10/01/2011, 10:22 AM

## 2011-10-09 NOTE — Discharge Summary (Signed)
Physician Discharge Summary Note  Patient:  Tiffany Johns is an 31 y.o., female MRN:  [id number] DOB:  1980-07-14 Patient phone:  [phone] (home)  Patient address:   [address]  [place] [address],   Date of Admission:  09/25/2011 Date of Discharge: 09/26/2011  Reason for Admission: This is a 31 year old [place] female, admitted to [facility] with complaints of suicide attempt by ingesting a pesticide. Patient reports, "I am in this hospital because I swallowed a medicine used to kill roaches after having a misunderstanding with my husband. I had a job in the past, but was laid off. However, I was called back to start my job again. I was happy and excited to go back to my job and also see my friends again. But my husband said I should not go back to work, rather to stay home and take care of my children and his elderly sick parents. I did not like the idea of staying at home. I rather work outside the home because I want to make some money and establish some credit for me. After we had the argument, I got mad. I was not thinking straight, so I took the pesticide to kill myself. My husband realized what I had done and brought me to the hospital. Unfortunately, I did not make the right decision. I regretted my action and I am ashamed of what I had done. This is my first time to do this, it is unlike me. I am happy with my husband and my family. I love my family and I will not do this kind of thing again. I need to go home because, no one is caring for my children right now. I am not depressed"  Discharge Diagnoses: Principal Problem:  *Major depressive disorder, recurrent episode, moderate Active Problems:  Suicide attempt by drug ingestion  Axis Diagnosis:   AXIS I:  Major Depression, Recurrent severe AXIS II:  Deferred AXIS III:   Past Medical History  Diagnosis Date  . Hypotension    AXIS IV:  other psychosocial or environmental problems AXIS V:  51-60 moderate symptoms  Level of  Care:  None indicated.  Hospital Course:   Pt was admitted of stabilization.  She was started on no medications.  Through a Nurse, learning disability, it was learned that she had been very frustrated with her husband who would not talk to her for 3 days prior to her overdose.  She learned that she needed to develop a plan for establishing and maintaining communication with her husband so that things do not get so hopeless for her.  She agreed to establish someone that she could use in that capacity.  She left with her husband and priest with the notion that. there were no concerns about her safety  Consults:  None  Significant Diagnostic Studies:  None  Discharge Vitals:   Blood pressure 124/77, pulse 86, temperature 98.4 F (36.9 C), temperature source Oral, resp. rate 16, height 4\' 10"  (1.473 m), weight 47.174 kg (104 lb), last menstrual period 09/08/2011.  Mental Status Exam: See Mental Status Examination and Suicide Risk Assessment completed by Attending Physician prior to discharge.  Discharge destination:  Home  Is patient on multiple antipsychotic therapies at discharge:  No   Has Patient had three or more failed trials of antipsychotic monotherapy by history:  N/A  Recommended Plan for Multiple Antipsychotic Therapies: N/A   Medication List    Notice       You have not been  prescribed any medications.              Follow-up recommendations:   Activities: Resume typical activities Diet: Resume typical diet Other: Follow up with outpatient provider and report any side effects to out patient prescriber.  Comments:    SignedDan Humphreys, Felix Meras 10/09/2011, 2:45 PM

## 2013-07-15 ENCOUNTER — Emergency Department (HOSPITAL_COMMUNITY)
Admission: EM | Admit: 2013-07-15 | Discharge: 2013-07-15 | Disposition: A | Discharge status: Home / Self Care | Payer: Self-pay | Attending: Emergency Medicine | Admitting: Emergency Medicine

## 2013-07-15 ENCOUNTER — Encounter (HOSPITAL_COMMUNITY): Payer: Self-pay | Admitting: Emergency Medicine

## 2013-07-15 ENCOUNTER — Emergency Department (INDEPENDENT_AMBULATORY_CARE_PROVIDER_SITE_OTHER)
Admission: EM | Admit: 2013-07-15 | Discharge: 2013-07-15 | Disposition: A | Discharge status: Nursing Home (Includes ICF) | Payer: Self-pay | Source: Home / Self Care | Attending: Emergency Medicine | Admitting: Emergency Medicine

## 2013-07-15 DIAGNOSIS — M436 Torticollis: Secondary | ICD-10-CM

## 2013-07-15 DIAGNOSIS — Z3202 Encounter for pregnancy test, result negative: Secondary | ICD-10-CM | POA: Insufficient documentation

## 2013-07-15 DIAGNOSIS — R51 Headache: Secondary | ICD-10-CM

## 2013-07-15 DIAGNOSIS — R112 Nausea with vomiting, unspecified: Secondary | ICD-10-CM

## 2013-07-15 DIAGNOSIS — J069 Acute upper respiratory infection, unspecified: Secondary | ICD-10-CM

## 2013-07-15 DIAGNOSIS — R519 Headache, unspecified: Secondary | ICD-10-CM

## 2013-07-15 DIAGNOSIS — Z8679 Personal history of other diseases of the circulatory system: Secondary | ICD-10-CM | POA: Insufficient documentation

## 2013-07-15 DIAGNOSIS — R110 Nausea: Secondary | ICD-10-CM | POA: Insufficient documentation

## 2013-07-15 LAB — POCT PREGNANCY, URINE: Preg Test, Ur: NEGATIVE

## 2013-07-15 LAB — POCT RAPID STREP A: Streptococcus, Group A Screen (Direct): NEGATIVE

## 2013-07-15 MED ORDER — DIPHENHYDRAMINE HCL 50 MG/ML IJ SOLN
25.0000 mg | Freq: Once | INTRAMUSCULAR | Status: AC
  Administered 2013-07-15: 25 mg via INTRAVENOUS
  Filled 2013-07-15: qty 1

## 2013-07-15 MED ORDER — ACETAMINOPHEN 325 MG PO TABS
650.0000 mg | ORAL_TABLET | Freq: Once | ORAL | Status: AC
  Administered 2013-07-15: 650 mg via ORAL
  Filled 2013-07-15: qty 2

## 2013-07-15 MED ORDER — DEXAMETHASONE SODIUM PHOSPHATE 10 MG/ML IJ SOLN
10.0000 mg | Freq: Once | INTRAMUSCULAR | Status: AC
  Administered 2013-07-15: 10 mg via INTRAVENOUS
  Filled 2013-07-15: qty 1

## 2013-07-15 MED ORDER — SODIUM CHLORIDE 0.9 % IV BOLUS (SEPSIS)
1000.0000 mL | INTRAVENOUS | Status: AC
  Administered 2013-07-15: 1000 mL via INTRAVENOUS

## 2013-07-15 MED ORDER — METOCLOPRAMIDE HCL 5 MG/ML IJ SOLN
5.0000 mg | Freq: Once | INTRAMUSCULAR | Status: AC
  Administered 2013-07-15: 5 mg via INTRAVENOUS
  Filled 2013-07-15: qty 2

## 2013-07-15 NOTE — ED Provider Notes (Signed)
CSN: [id number]     Arrival date & time 07/15/13  0907 History   First MD Initiated Contact with Patient 07/15/13 [phone]     Chief Complaint  Patient presents with  . URI   (Consider location/radiation/quality/duration/timing/severity/associated sxs/prior Treatment) HPI Comments: 33 year old female presents complaining of headache, nausea, vomiting, sore throat, fever, chills, neck stiffness. Symptoms began yesterday. She had gradual onset of headache that has become continuously worse, spots in thereafter by the nausea and vomiting. The rest of the symptoms that began since then. The throat is so painful that she cannot even swallow she feels overall very ill. She also has some left-sided chest pain, worse with exertion and relieved by rest for the past couple of years, that has gotten worse in the last 2 days. She's never had this chest pain evaluated. She also admits to feeling very dizzy and lightheaded which is causing her to vomit more.   Past Medical History  Diagnosis Date  . Hypotension    Past Surgical History  Procedure Laterality Date  . Cesarean section  x2  . Esophagogastroduodenoscopy  09/20/2011    Procedure: ESOPHAGOGASTRODUODENOSCOPY (EGD);  Surgeon: Beverley FiedlerJay M Pyrtle, MD;  [facility];  Service: Gastroenterology;  Laterality: N/A;   Family History  Problem Relation Age of Onset  . Anesthesia problems Neg Hx   . Hypotension Neg Hx   . Malignant hyperthermia Neg Hx   . Pseudochol deficiency Neg Hx    History  Substance Use Topics  . Smoking status: Never Smoker   . Smokeless tobacco: Not on file  . Alcohol Use: No   OB History   Grav Para Term Preterm Abortions TAB SAB Ect Mult Living                 Review of Systems  Constitutional: Positive for fever and chills.  HENT: Positive for sore throat.   Eyes: Negative for visual disturbance.  Respiratory: Positive for cough. Negative for shortness of breath.   Cardiovascular: Positive for chest pain.  Negative for palpitations and leg swelling.  Gastrointestinal: Positive for nausea and vomiting. Negative for abdominal pain and diarrhea.  Endocrine: Negative for polydipsia and polyuria.  Genitourinary: Negative for dysuria, urgency and frequency.  Musculoskeletal: Positive for neck stiffness. Negative for arthralgias and myalgias.  Skin: Negative for rash.  Neurological: Positive for dizziness and light-headedness. Negative for weakness.    Allergies  Review of patient's allergies indicates no known allergies.  Home Medications  No current outpatient prescriptions on file. BP 111/74  Pulse 77  Temp(Src) 98.1 F (36.7 C) (Oral)  Resp 16  SpO2 100%  LMP 07/06/2013 Physical Exam  Nursing note and vitals reviewed. Constitutional: She is oriented to person, place, and time. Vital signs are normal. She appears well-developed and well-nourished. No distress.  HENT:  Head: Normocephalic and atraumatic.  Shallow ulcerations in the posterior pharynx   Pulmonary/Chest: Effort normal. No respiratory distress.  Neurological: She is alert and oriented to person, place, and time. She has normal strength. No cranial nerve deficit. Coordination normal.  Skin: Skin is warm and dry. No rash noted. She is not diaphoretic.  Psychiatric: She has a normal mood and affect. Judgment normal.    ED Course  Procedures (including critical care time) Labs Review Labs Reviewed  POCT RAPID STREP A ([facility] CARE ONLY)   Imaging Review No results found.    MDM   1. Headache   2. Nausea & vomiting   3. Neck stiffness  Exam is difficult due to poor patient comprehension of what I was asking her to do despite using the language interpreter line. Discussed with attending, her symptoms are concerning for possible intracranial process or encephalitis. She is being transferred to the ED via shuttle for further evaluation.    Graylon Good, PA-C 07/15/13 1143  Graylon Good, PA-C 07/15/13  1143

## 2013-07-15 NOTE — ED Notes (Addendum)
Via husband who speaks [place]... Pt c/o cold sxs onset yest w/sxs that include: fevers, HA, ST, chest pain, vomiting, chills, dizziness Denies: diarrhea, SOB, wheezing... Has not been taking meds for sxs She is alert w/no signs of acute distress.

## 2013-07-15 NOTE — ED Notes (Signed)
Pt sent here by UC to r/o meningitis.  C/o headache, worse by L eye.  Pt photophobic and nauseated.  Also c/o sore throat.  Denies cough.  Translator phone must be used.  Pt wearing yellow mask.

## 2013-07-15 NOTE — ED Provider Notes (Signed)
CSN: [id number]     Arrival date & time 07/15/13  1106 History   First MD Initiated Contact with Patient 07/15/13 1227     Chief Complaint  Patient presents with  . Migraine    r/o meningitis   (Consider location/radiation/quality/duration/timing/severity/associated sxs/prior Treatment) Patient is a 33 y.o. female presenting with migraines. The history is provided by the patient.  Migraine This is a new problem. The current episode started yesterday. The problem occurs constantly. The problem has not changed since onset.Associated symptoms include headaches. Pertinent negatives include no chest pain, no abdominal pain and no shortness of breath. Nothing aggravates the symptoms. Nothing relieves the symptoms. She has tried nothing for the symptoms. The treatment provided no relief.    Past Medical History  Diagnosis Date  . Hypotension    Past Surgical History  Procedure Laterality Date  . Cesarean section  x2  . Esophagogastroduodenoscopy  09/20/2011    Procedure: ESOPHAGOGASTRODUODENOSCOPY (EGD);  Surgeon: Beverley FiedlerJay M Pyrtle, MD;  [facility];  Service: Gastroenterology;  Laterality: N/A;   Family History  Problem Relation Age of Onset  . Anesthesia problems Neg Hx   . Hypotension Neg Hx   . Malignant hyperthermia Neg Hx   . Pseudochol deficiency Neg Hx    History  Substance Use Topics  . Smoking status: Never Smoker   . Smokeless tobacco: Not on file  . Alcohol Use: No   OB History   Grav Para Term Preterm Abortions TAB SAB Ect Mult Living                 Review of Systems  Constitutional: Positive for fever (subjective) and chills. Negative for fatigue.  HENT: Positive for sore throat. Negative for congestion and drooling.   Eyes: Negative for pain.  Respiratory: Negative for cough and shortness of breath.   Cardiovascular: Negative for chest pain.  Gastrointestinal: Positive for nausea. Negative for vomiting, abdominal pain and diarrhea.  Genitourinary:  Negative for dysuria and hematuria.  Musculoskeletal: Negative for back pain, gait problem and neck pain.  Skin: Negative for color change.  Neurological: Positive for headaches. Negative for dizziness.  Hematological: Negative for adenopathy.  Psychiatric/Behavioral: Negative for behavioral problems.  All other systems reviewed and are negative.    Allergies  Review of patient's allergies indicates no known allergies.  Home Medications  No current outpatient prescriptions on file. BP 95/68  Pulse 69  Temp(Src) 98 F (36.7 C) (Oral)  Resp 16  Wt 114 lb (51.71 kg)  SpO2 100%  LMP 07/06/2013 Physical Exam  Nursing note and vitals reviewed. Constitutional: She is oriented to person, place, and time. She appears well-developed and well-nourished.  HENT:  Head: Normocephalic.  Mouth/Throat: No oropharyngeal exudate.  Shallow ulcerations noted in the bilateral posterior oropharynx.    Eyes: Conjunctivae and EOM are normal. Pupils are equal, round, and reactive to light.  No photophobia on my exam.   Neck: Normal range of motion. Neck supple.  Normal range of motion of the neck without pain.  Cardiovascular: Normal rate, regular rhythm, normal heart sounds and intact distal pulses.  Exam reveals no gallop and no friction rub.   No murmur heard. Pulmonary/Chest: Effort normal and breath sounds normal. No respiratory distress. She has no wheezes.  Abdominal: Soft. Bowel sounds are normal. There is no tenderness. There is no rebound and no guarding.  Musculoskeletal: Normal range of motion. She exhibits no edema and no tenderness.  Neurological: She is alert and oriented to person, place,  and time. She has normal strength. No cranial nerve deficit or sensory deficit. She displays a negative Romberg sign. Coordination and gait normal.  Normal finger to nose bilaterally.  Normal speech and understanding.  The patient relates forwards and backwards without difficulty.  Skin: Skin is  warm and dry.  Psychiatric: She has a normal mood and affect. Her behavior is normal.    ED Course  Procedures (including critical care time) Labs Review Labs Reviewed  CULTURE, GROUP A STREP  POCT PREGNANCY, URINE   Imaging Review No results found.  EKG Interpretation   None       MDM   1. Viral URI   2. Headache    12:53 PM 33 y.o. female who presents from urgent care with symptoms consistent with a viral syndrome. She states that yesterday morning she developed a sore throat, gradual onset headache, chills, and nausea. She denies any documented fevers but has had a subjective fever and chills. She was seen in urgent care today and sent here for evaluation for intracranial process versus meningitis. I reviewed the urgent care note. Per their note she complained of chest pain and vomiting. She denies these symptoms here. She is afebrile and her vital signs are unremarkable here. She had a negative strep screen at the urgent care. She has normal range of motion of her neck and no signs concerning for meningitis. She has a normal neurologic exam. I suspect she has a viral syndrome. Will treat the headache with a migraine cocktail.  2:31 PM: HA now 1/10, pt ambulatory and continues to appears well. Neg poct preg. I have discussed the diagnosis/risks/treatment options with the patient and believe the pt to be eligible for discharge home to follow-up with her pcp as needed. We also discussed returning to the ED immediately if new or worsening sx occur. We discussed the sx which are most concerning (e.g., worsening HA, fever) that necessitate immediate return. Any new prescriptions provided to the patient are listed below.  New Prescriptions   No medications on file       Junius Argyle, MD 07/15/13 1435

## 2013-07-15 NOTE — ED Provider Notes (Signed)
Medical screening examination/treatment/procedure(s) were performed by non-physician practitioner and as supervising physician I was immediately available for consultation/collaboration.  Aleister Lady, M.D.  Caya Soberanis C Jeovany Huitron, MD 07/15/13 1532 

## 2013-07-17 LAB — CULTURE, GROUP A STREP

## 2014-11-06 ENCOUNTER — Emergency Department (HOSPITAL_COMMUNITY)
Admission: EM | Admit: 2014-11-06 | Discharge: 2014-11-07 | Disposition: A | Discharge status: Home / Self Care | Payer: Medicaid Other | Attending: Emergency Medicine | Admitting: Emergency Medicine

## 2014-11-06 ENCOUNTER — Encounter (HOSPITAL_COMMUNITY): Payer: Self-pay | Admitting: Emergency Medicine

## 2014-11-06 ENCOUNTER — Emergency Department (HOSPITAL_COMMUNITY): Payer: Medicaid Other

## 2014-11-06 DIAGNOSIS — Z8679 Personal history of other diseases of the circulatory system: Secondary | ICD-10-CM | POA: Diagnosis not present

## 2014-11-06 DIAGNOSIS — J069 Acute upper respiratory infection, unspecified: Secondary | ICD-10-CM | POA: Diagnosis not present

## 2014-11-06 DIAGNOSIS — R509 Fever, unspecified: Secondary | ICD-10-CM | POA: Diagnosis present

## 2014-11-06 LAB — CBC WITH DIFFERENTIAL/PLATELET
Basophils Absolute: 0 10*3/uL (ref 0.0–0.1)
Basophils Relative: 0 % (ref 0–1)
Eosinophils Absolute: 0 10*3/uL (ref 0.0–0.7)
Eosinophils Relative: 1 % (ref 0–5)
HEMATOCRIT: 39.1 % (ref 36.0–46.0)
Hemoglobin: 13 g/dL (ref 12.0–15.0)
Lymphocytes Relative: 9 % — ABNORMAL LOW (ref 12–46)
Lymphs Abs: 0.6 10*3/uL — ABNORMAL LOW (ref 0.7–4.0)
MCH: 29 pg (ref 26.0–34.0)
MCHC: 33.2 g/dL (ref 30.0–36.0)
MCV: 87.1 fL (ref 78.0–100.0)
MONO ABS: 0.8 10*3/uL (ref 0.1–1.0)
Monocytes Relative: 12 % (ref 3–12)
Neutro Abs: 5.1 10*3/uL (ref 1.7–7.7)
Neutrophils Relative %: 78 % — ABNORMAL HIGH (ref 43–77)
Platelets: 117 10*3/uL — ABNORMAL LOW (ref 150–400)
RBC: 4.49 MIL/uL (ref 3.87–5.11)
RDW: 13.1 % (ref 11.5–15.5)
WBC: 6.5 10*3/uL (ref 4.0–10.5)

## 2014-11-06 LAB — I-STAT CHEM 8, ED
BUN: 12 mg/dL (ref 6–20)
Calcium, Ion: 1.15 mmol/L (ref 1.12–1.23)
Chloride: 104 mmol/L (ref 101–111)
Creatinine, Ser: 0.6 mg/dL (ref 0.44–1.00)
Glucose, Bld: 89 mg/dL (ref 70–99)
HCT: 40 % (ref 36.0–46.0)
Hemoglobin: 13.6 g/dL (ref 12.0–15.0)
POTASSIUM: 3.5 mmol/L (ref 3.5–5.1)
SODIUM: 139 mmol/L (ref 135–145)
TCO2: 18 mmol/L (ref 0–100)

## 2014-11-06 LAB — RAPID STREP SCREEN (MED CTR MEBANE ONLY): Streptococcus, Group A Screen (Direct): NEGATIVE

## 2014-11-06 MED ORDER — HYDROCOD POLST-CPM POLST ER 10-8 MG/5ML PO SUER
5.0000 mL | Freq: Once | ORAL | Status: AC
  Administered 2014-11-06: 5 mL via ORAL
  Filled 2014-11-06: qty 5

## 2014-11-06 MED ORDER — HYDROCODONE-HOMATROPINE 5-1.5 MG/5ML PO SYRP: 5.0000 mL | ORAL_SOLUTION | Freq: Four times a day (QID) | ORAL | Status: DC | PRN

## 2014-11-06 MED ORDER — ACETAMINOPHEN 325 MG PO TABS
ORAL_TABLET | ORAL | Status: AC
  Filled 2014-11-06: qty 2

## 2014-11-06 MED ORDER — IBUPROFEN 400 MG PO TABS
600.0000 mg | ORAL_TABLET | Freq: Once | ORAL | Status: AC
  Administered 2014-11-06: 600 mg via ORAL
  Filled 2014-11-06 (×2): qty 1

## 2014-11-06 MED ORDER — ACETAMINOPHEN 325 MG PO TABS
650.0000 mg | ORAL_TABLET | Freq: Once | ORAL | Status: AC
  Administered 2014-11-06: 650 mg via ORAL

## 2014-11-06 NOTE — ED Provider Notes (Signed)
CSN: [id number]     Arrival date & time 11/06/14  2057 History   First MD Initiated Contact with Patient 11/06/14 2144     Chief Complaint  Patient presents with  . Fever     (Consider location/radiation/quality/duration/timing/severity/associated sxs/prior Treatment) HPI Tiffany Johns is a 34 y.o. female with hx of hypotension, presents to ED with complaint fever, chills, sore throat, cough. Symptoms started this morning. States she did not take any medications prior to coming in. She denies any recent ill contacts. She admits to headache but denies any neck pain or stiffness. She denies any shortness of breath, but states it does hurt to cough. She denies any nausea, vomiting, diarrhea. No abdominal pain. No urinary symptoms. Patient is otherwise healthy with no medical problems.  Past Medical History  Diagnosis Date  . Hypotension    Past Surgical History  Procedure Laterality Date  . Cesarean section  x2  . Esophagogastroduodenoscopy  09/20/2011    Procedure: ESOPHAGOGASTRODUODENOSCOPY (EGD);  Surgeon: Beverley FiedlerJay M Pyrtle, MD;  [facility];  Service: Gastroenterology;  Laterality: N/A;   Family History  Problem Relation Age of Onset  . Anesthesia problems Neg Hx   . Hypotension Neg Hx   . Malignant hyperthermia Neg Hx   . Pseudochol deficiency Neg Hx    History  Substance Use Topics  . Smoking status: Never Smoker   . Smokeless tobacco: Not on file  . Alcohol Use: No   OB History    No data available     Review of Systems  Constitutional: Positive for fever and chills.  HENT: Positive for congestion and sore throat.   Respiratory: Positive for cough. Negative for chest tightness and shortness of breath.   Cardiovascular: Positive for chest pain. Negative for palpitations and leg swelling.  Gastrointestinal: Negative for nausea, vomiting, abdominal pain and diarrhea.  Genitourinary: Negative for dysuria and flank pain.  Musculoskeletal: Positive for myalgias.  Negative for neck pain and neck stiffness.  Skin: Negative for rash.  Neurological: Negative for dizziness, weakness and headaches.  All other systems reviewed and are negative.     Allergies  Review of patient's allergies indicates no known allergies.  Home Medications   Prior to Admission medications   Not on File   BP 92/57 mmHg  Pulse 93  Temp(Src) 100.6 F (38.1 C) (Oral)  Resp 20  Ht 5' (1.524 m)  Wt 115 lb 3 oz (52.249 kg)  BMI 22.50 kg/m2  SpO2 94%  LMP 11/06/2014 Physical Exam  Constitutional: She is oriented to person, place, and time. She appears well-developed and well-nourished. No distress.  HENT:  Head: Normocephalic.  Right Ear: Tympanic membrane, external ear and ear canal normal.  Left Ear: Tympanic membrane, external ear and ear canal normal.  Nose: Nose normal.  Mouth/Throat: Uvula is midline and mucous membranes are normal. Posterior oropharyngeal erythema present. No oropharyngeal exudate or posterior oropharyngeal edema.  Eyes: Conjunctivae are normal.  Neck: Neck supple.  Cardiovascular: Normal rate, regular rhythm and normal heart sounds.   Pulmonary/Chest: Effort normal and breath sounds normal. No respiratory distress. She has no wheezes. She has no rales.  Abdominal: Soft. Bowel sounds are normal. She exhibits no distension. There is no tenderness. There is no rebound.  Musculoskeletal: She exhibits no edema.  Neurological: She is alert and oriented to person, place, and time.  Skin: Skin is warm and dry.  Psychiatric: She has a normal mood and affect. Her behavior is normal.  Nursing note and vitals  reviewed.   ED Course  Procedures (including critical care time) Labs Review Labs Reviewed  CBC WITH DIFFERENTIAL/PLATELET - Abnormal; Notable for the following:    Platelets 117 (*)    Neutrophils Relative % 78 (*)    Lymphocytes Relative 9 (*)    Lymphs Abs 0.6 (*)    All other components within normal limits  RAPID STREP SCREEN   CULTURE, GROUP A STREP  I-STAT CHEM 8, ED    Imaging Review Dg Chest 2 View  11/06/2014   CLINICAL DATA:  Cough fever 103 degrees for past 3 days  EXAM: CHEST  2 VIEW  COMPARISON:  01/23/2011  FINDINGS: Heart size and vascular pattern are normal. No consolidation or effusion. No abnormal opacities.  IMPRESSION: Negative   Electronically Signed   By: Esperanza Heir M.D.   On: 11/06/2014 21:47     EKG Interpretation None      MDM   Final diagnoses:  Viral URI   Pt with flu like symptoms. No nuchal rigidity. No signs of peritonsillar abscess. Pt is coughing. CXR, labs already obtained by triage and are negative. Strep obtained and is negative. Patient has marginal blood pressure, she states she has history of hypertension and runs in her family. At this time given no elevation of white count, is nontoxic appearing, temperature improved, she is tachycardic, I do not think she is septic. This patient has a viral upper respiratory tract infection, possibly influenza. We'll discharge her home with cough medications, Toprol and Tylenol for fever and pain, follow-up. Return precautions discussed.   Filed Vitals:   11/06/14 2105 11/06/14 2114 11/06/14 2200 11/06/14 2215  BP: 108/74 108/74 90/55 92/57   Pulse: 107 108 91 93  Temp: 100.6 F (38.1 C) 100.6 F (38.1 C)    TempSrc: Oral Oral    Resp: 20 20    Height: 5' (1.524 m) 5' (1.524 m)    Weight:  115 lb 3 oz (52.249 kg)    SpO2: 99% 100% 94% 94%     Jaynie Crumble, PA-C 11/06/14 2342  Nelva Nay, MD 11/06/14 [phone]

## 2014-11-06 NOTE — Discharge Instructions (Signed)
Take tylenol or motrin for body aches, fever, headache. Take hycodan for cough and pain. Salt water gargles. Follow up with your primary care doctor.    Upper Respiratory Infection, Adult An upper respiratory infection (URI) is also sometimes known as the common cold. The upper respiratory tract includes the nose, sinuses, throat, trachea, and bronchi. Bronchi are the airways leading to the lungs. Most people improve within 1 week, but symptoms can last up to 2 weeks. A residual cough may last even longer.  CAUSES Many different viruses can infect the tissues lining the upper respiratory tract. The tissues become irritated and inflamed and often become very moist. Mucus production is also common. A cold is contagious. You can easily spread the virus to others by oral contact. This includes kissing, sharing a glass, coughing, or sneezing. Touching your mouth or nose and then touching a surface, which is then touched by another person, can also spread the virus. SYMPTOMS  Symptoms typically develop 1 to 3 days after you come in contact with a cold virus. Symptoms vary from person to person. They may include:  Runny nose.  Sneezing.  Nasal congestion.  Sinus irritation.  Sore throat.  Loss of voice (laryngitis).  Cough.  Fatigue.  Muscle aches.  Loss of appetite.  Headache.  Low-grade fever. DIAGNOSIS  You might diagnose your own cold based on familiar symptoms, since most people get a cold 2 to 3 times a year. Your caregiver can confirm this based on your exam. Most importantly, your caregiver can check that your symptoms are not due to another disease such as strep throat, sinusitis, pneumonia, asthma, or epiglottitis. Blood tests, throat tests, and X-rays are not necessary to diagnose a common cold, but they may sometimes be helpful in excluding other more serious diseases. Your caregiver will decide if any further tests are required. RISKS AND COMPLICATIONS  You may be at risk  for a more severe case of the common cold if you smoke cigarettes, have chronic heart disease (such as heart failure) or lung disease (such as asthma), or if you have a weakened immune system. The very young and very old are also at risk for more serious infections. Bacterial sinusitis, middle ear infections, and bacterial pneumonia can complicate the common cold. The common cold can worsen asthma and chronic obstructive pulmonary disease (COPD). Sometimes, these complications can require emergency medical care and may be life-threatening. PREVENTION  The best way to protect against getting a cold is to practice good hygiene. Avoid oral or hand contact with people with cold symptoms. Wash your hands often if contact occurs. There is no clear evidence that vitamin C, vitamin E, echinacea, or exercise reduces the chance of developing a cold. However, it is always recommended to get plenty of rest and practice good nutrition. TREATMENT  Treatment is directed at relieving symptoms. There is no cure. Antibiotics are not effective, because the infection is caused by a virus, not by bacteria. Treatment may include:  Increased fluid intake. Sports drinks offer valuable electrolytes, sugars, and fluids.  Breathing heated mist or steam (vaporizer or shower).  Eating chicken soup or other clear broths, and maintaining good nutrition.  Getting plenty of rest.  Using gargles or lozenges for comfort.  Controlling fevers with ibuprofen or acetaminophen as directed by your caregiver.  Increasing usage of your inhaler if you have asthma. Zinc gel and zinc lozenges, taken in the first 24 hours of the common cold, can shorten the duration and lessen the severity  of symptoms. Pain medicines may help with fever, muscle aches, and throat pain. A variety of non-prescription medicines are available to treat congestion and runny nose. Your caregiver can make recommendations and may suggest nasal or lung inhalers for other  symptoms.  HOME CARE INSTRUCTIONS   Only take over-the-counter or prescription medicines for pain, discomfort, or fever as directed by your caregiver.  Use a warm mist humidifier or inhale steam from a shower to increase air moisture. This may keep secretions moist and make it easier to breathe.  Drink enough water and fluids to keep your urine clear or pale yellow.  Rest as needed.  Return to work when your temperature has returned to normal or as your caregiver advises. You may need to stay home longer to avoid infecting others. You can also use a face mask and careful hand washing to prevent spread of the virus. SEEK MEDICAL CARE IF:   After the first few days, you feel you are getting worse rather than better.  You need your caregiver's advice about medicines to control symptoms.  You develop chills, worsening shortness of breath, or brown or red sputum. These may be signs of pneumonia.  You develop yellow or brown nasal discharge or pain in the face, especially when you bend forward. These may be signs of sinusitis.  You develop a fever, swollen neck glands, pain with swallowing, or white areas in the back of your throat. These may be signs of strep throat. SEEK IMMEDIATE MEDICAL CARE IF:   You have a fever.  You develop severe or persistent headache, ear pain, sinus pain, or chest pain.  You develop wheezing, a prolonged cough, cough up blood, or have a change in your usual mucus (if you have chronic lung disease).  You develop sore muscles or a stiff neck. Document Released: 12/18/2000 Document Revised: 09/16/2011 Document Reviewed: 09/29/2013 [facility] CenterExitCare Patient Information 2015 [place], [place]. This information is not intended to replace advice given to you by your health care provider. Make sure you discuss any questions you have with your health care provider.

## 2014-11-06 NOTE — ED Notes (Signed)
Pt c/o elevated temp, sore throat, non productive cough and chest pain, onset this am  Pt denies nausea or vomiting

## 2014-11-09 LAB — CULTURE, GROUP A STREP: STREP A CULTURE: NEGATIVE

## 2015-11-09 ENCOUNTER — Emergency Department (HOSPITAL_COMMUNITY): Payer: Medicaid Other

## 2015-11-09 ENCOUNTER — Encounter (HOSPITAL_COMMUNITY): Payer: Self-pay | Admitting: Emergency Medicine

## 2015-11-09 ENCOUNTER — Emergency Department (HOSPITAL_COMMUNITY)
Admission: EM | Admit: 2015-11-09 | Discharge: 2015-11-09 | Disposition: A | Discharge status: Home / Self Care | Payer: Medicaid Other | Attending: Emergency Medicine | Admitting: Emergency Medicine

## 2015-11-09 DIAGNOSIS — Z3202 Encounter for pregnancy test, result negative: Secondary | ICD-10-CM | POA: Insufficient documentation

## 2015-11-09 DIAGNOSIS — Z8679 Personal history of other diseases of the circulatory system: Secondary | ICD-10-CM | POA: Diagnosis not present

## 2015-11-09 DIAGNOSIS — K8020 Calculus of gallbladder without cholecystitis without obstruction: Secondary | ICD-10-CM | POA: Diagnosis not present

## 2015-11-09 DIAGNOSIS — R1010 Upper abdominal pain, unspecified: Secondary | ICD-10-CM | POA: Diagnosis present

## 2015-11-09 LAB — CBC WITH DIFFERENTIAL/PLATELET
Basophils Absolute: 0 10*3/uL (ref 0.0–0.1)
Basophils Relative: 0 %
Eosinophils Absolute: 0.2 10*3/uL (ref 0.0–0.7)
Eosinophils Relative: 3 %
HCT: 39.8 % (ref 36.0–46.0)
HEMOGLOBIN: 13.1 g/dL (ref 12.0–15.0)
Lymphocytes Relative: 30 %
Lymphs Abs: 2.7 10*3/uL (ref 0.7–4.0)
MCH: 28.6 pg (ref 26.0–34.0)
MCHC: 32.9 g/dL (ref 30.0–36.0)
MCV: 86.9 fL (ref 78.0–100.0)
MONO ABS: 0.6 10*3/uL (ref 0.1–1.0)
Monocytes Relative: 7 %
Neutro Abs: 5.5 10*3/uL (ref 1.7–7.7)
Neutrophils Relative %: 60 %
Platelets: 174 10*3/uL (ref 150–400)
RBC: 4.58 MIL/uL (ref 3.87–5.11)
RDW: 13.2 % (ref 11.5–15.5)
WBC: 9 10*3/uL (ref 4.0–10.5)

## 2015-11-09 LAB — COMPREHENSIVE METABOLIC PANEL
ALBUMIN: 3.6 g/dL (ref 3.5–5.0)
ALK PHOS: 48 U/L (ref 38–126)
ALT: 28 U/L (ref 14–54)
ANION GAP: 10 (ref 5–15)
AST: 29 U/L (ref 15–41)
BUN: 8 mg/dL (ref 6–20)
CALCIUM: 8.5 mg/dL — AB (ref 8.9–10.3)
CO2: 18 mmol/L — ABNORMAL LOW (ref 22–32)
Chloride: 107 mmol/L (ref 101–111)
Creatinine, Ser: 0.58 mg/dL (ref 0.44–1.00)
GFR calc Af Amer: >60 mL/min (ref >60)
GLUCOSE: 115 mg/dL — AB (ref 65–99)
POTASSIUM: 4 mmol/L (ref 3.5–5.1)
Sodium: 135 mmol/L (ref 135–145)
Total Bilirubin: 0.9 mg/dL (ref 0.3–1.2)
Total Protein: 6.9 g/dL (ref 6.5–8.1)

## 2015-11-09 LAB — URINALYSIS, ROUTINE W REFLEX MICROSCOPIC
BILIRUBIN URINE: NEGATIVE
GLUCOSE, UA: NEGATIVE mg/dL
Hgb urine dipstick: NEGATIVE
Ketones, ur: NEGATIVE mg/dL
LEUKOCYTES UA: NEGATIVE
Nitrite: NEGATIVE
PH: 7 (ref 5.0–8.0)
PROTEIN: NEGATIVE mg/dL
Specific Gravity, Urine: 1.01 (ref 1.005–1.030)

## 2015-11-09 LAB — LIPASE, BLOOD: Lipase: 49 U/L (ref 11–51)

## 2015-11-09 LAB — POC URINE PREG, ED: Preg Test, Ur: NEGATIVE

## 2015-11-09 MED ORDER — FAMOTIDINE IN NACL 20-0.9 MG/50ML-% IV SOLN
20.0000 mg | Freq: Once | INTRAVENOUS | Status: AC
  Administered 2015-11-09: 20 mg via INTRAVENOUS
  Filled 2015-11-09: qty 50

## 2015-11-09 MED ORDER — HYDROCODONE-ACETAMINOPHEN 5-325 MG PO TABS: 1.0000 | ORAL_TABLET | ORAL | Status: DC | PRN

## 2015-11-09 MED ORDER — ONDANSETRON HCL 4 MG PO TABS: 4.0000 mg | ORAL_TABLET | Freq: Four times a day (QID) | ORAL | Status: DC

## 2015-11-09 MED ORDER — HYDROMORPHONE HCL 1 MG/ML IJ SOLN
0.5000 mg | Freq: Once | INTRAMUSCULAR | Status: AC
  Administered 2015-11-09: 0.5 mg via INTRAVENOUS
  Filled 2015-11-09: qty 1

## 2015-11-09 MED ORDER — ONDANSETRON HCL 4 MG/2ML IJ SOLN
4.0000 mg | Freq: Once | INTRAMUSCULAR | Status: AC
  Administered 2015-11-09: 4 mg via INTRAVENOUS
  Filled 2015-11-09: qty 2

## 2015-11-09 MED ORDER — RANITIDINE HCL 150 MG PO TABS
150.0000 mg | ORAL_TABLET | Freq: Two times a day (BID) | ORAL | Status: DC
Start: 2015-11-09 — End: 2016-02-02

## 2015-11-09 NOTE — ED Notes (Signed)
Pt left at this time with all belongings.  

## 2015-11-09 NOTE — ED Notes (Signed)
Pt arrives with epigastric pain starting this evening, c/o nausea but no vomiting, no diarrhea. No SHOB, no radiation of pain. NAD.

## 2015-11-09 NOTE — ED Provider Notes (Signed)
CSN: [id number]     Arrival date & time 11/09/15  0448 History   First MD Initiated Contact with Patient 11/09/15 [phone]     Chief Complaint  Patient presents with  . Abdominal Pain     (Consider location/radiation/quality/duration/timing/severity/associated sxs/prior Treatment) HPI Comments: Patient presents to the emergency department for evaluation of abdominal pain. Patient reports that she had onset of severe upper abdominal pain around midnight. Patient has not had any nausea, vomiting or diarrhea associated with the pain. She has never had similar pain before. She denies urinary symptoms. She has not identified alleviating or exacerbating factors.  Patient is a 35 y.o. female presenting with abdominal pain.  Abdominal Pain   Past Medical History  Diagnosis Date  . Hypotension    Past Surgical History  Procedure Laterality Date  . Cesarean section  x2  . Esophagogastroduodenoscopy  09/20/2011    Procedure: ESOPHAGOGASTRODUODENOSCOPY (EGD);  Surgeon: Tiffany FiedlerJay M Pyrtle, MD;  [facility];  Service: Gastroenterology;  Laterality: N/A;   Family History  Problem Relation Age of Onset  . Anesthesia problems Neg Hx   . Hypotension Neg Hx   . Malignant hyperthermia Neg Hx   . Pseudochol deficiency Neg Hx    Social History  Substance Use Topics  . Smoking status: Never Smoker   . Smokeless tobacco: None  . Alcohol Use: No   OB History    No data available     Review of Systems  Gastrointestinal: Positive for abdominal pain.  All other systems reviewed and are negative.     Allergies  Review of patient's allergies indicates no known allergies.  Home Medications   Prior to Admission medications   Medication Sig Start Date End Date Taking? Authorizing Provider  HYDROcodone-homatropine (HYCODAN) 5-1.5 MG/5ML syrup Take 5 mLs by mouth every 6 (six) hours as needed for cough. 11/06/14   Tiffany Kirichenko, PA-C   BP 118/81 mmHg  Pulse 75  Temp(Src) 98.1 F (36.7 C)  (Oral)  Resp 11  Ht 5\' 4"  (1.626 m)  Wt 124 lb (56.246 kg)  BMI 21.27 kg/m2  SpO2 100%  LMP 10/12/2015 (Exact Date) Physical Exam  Constitutional: She is oriented to person, place, and time. She appears well-developed and well-nourished. No distress.  HENT:  Head: Normocephalic and atraumatic.  Right Ear: Hearing normal.  Left Ear: Hearing normal.  Nose: Nose normal.  Mouth/Throat: Oropharynx is clear and moist and mucous membranes are normal.  Eyes: Conjunctivae and EOM are normal. Pupils are equal, round, and reactive to light.  Neck: Normal range of motion. Neck supple.  Cardiovascular: Regular rhythm, S1 normal and S2 normal.  Exam reveals no gallop and no friction rub.   No murmur heard. Pulmonary/Chest: Effort normal and breath sounds normal. No respiratory distress. She exhibits no tenderness.  Abdominal: Soft. Normal appearance and bowel sounds are normal. There is no hepatosplenomegaly. There is tenderness in the epigastric area. There is no rebound, no guarding, no tenderness at McBurney's point and negative Murphy's sign. No hernia.  Musculoskeletal: Normal range of motion.  Neurological: She is alert and oriented to person, place, and time. She has normal strength. No cranial nerve deficit or sensory deficit. Coordination normal. GCS eye subscore is 4. GCS verbal subscore is 5. GCS motor subscore is 6.  Skin: Skin is warm, dry and intact. No rash noted. No cyanosis.  Psychiatric: She has a normal mood and affect. Her speech is normal and behavior is normal. Thought content normal.  Nursing note and  vitals reviewed.   ED Course  Procedures (including critical care time) Labs Review Labs Reviewed  CBC WITH DIFFERENTIAL/PLATELET  COMPREHENSIVE METABOLIC PANEL  LIPASE, BLOOD  URINALYSIS, ROUTINE W REFLEX MICROSCOPIC (NOT AT [facility])    Imaging Review US Abdomen Limited Ruq  11/09/2015  CLINICAL DATA:  Upper abdominal pain. EXAM: US ABDOMEN LIMITED - RIGHT UPPER QUADRANT  COMPARISON:  CT abdomen and pelvis 01/17/2010 FINDINGS: Gallbladder: Floating particular matter within the gallbladder consistent with sludge and small stones. No gallbladder wall thickening or edema. Murphy's sign is negative. Common bile duct: Diameter: 2 mm, normal Liver: No focal lesion identified. Within normal limits in parenchymal echogenicity. IMPRESSION: Echogenic material within the gallbladder consistent with sludge and small stones. No changes otherwise to suggest cholecystitis. Electronically Signed   By: Burman Nieves M.D.   On: 11/09/2015 05:24   I have personally reviewed and evaluated these images and lab results as part of my medical decision-making.   EKG Interpretation   Date/Time:  Thursday Nov 09 2015 04:57:50 EDT Ventricular Rate:  81 PR Interval:  126 QRS Duration: 86 QT Interval:  388 QTC Calculation: 450 R Axis:   78 Text Interpretation:  Sinus rhythm Normal ECG Confirmed by POLLINA  MD,  CHRISTOPHER ([id number]) on 11/09/2015 5:07:11 AM      MDM   Final diagnoses:  Calculus of gallbladder without cholecystitis without obstruction    Patient presents to the emergency part for evaluation of abdominal pain. Patient reports upper abdominal pain with nausea but no vomiting. Pain is primarily epigastric. Examination did not reveal a Murphy sign or peritonitis. Gallbladder ultrasound reveals stones but no evidence of cholecystitis. Patient treated with analgesia, will be referred to general surgery as an outpatient.    Tiffany Crease, MD 11/09/15 [phone]

## 2015-11-09 NOTE — Discharge Instructions (Signed)
Cholelithiasis °Cholelithiasis (also called gallstones) is a form of gallbladder disease. The gallbladder is a small organ that helps you digest fats. Symptoms of gallstones are: °· Feeling sick to your stomach (nausea). °· Throwing up (vomiting). °· Belly pain. °· Yellowing of the skin (jaundice). °· Sudden pain. You may feel the pain for minutes to hours. °· Fever. °· Pain to the touch. °HOME CARE °· Only take medicines as told by your doctor. °· Eat a low-fat diet until you see your doctor again. Eating fat can result in pain. °· Follow up with your doctor as told. Attacks usually happen time after time. Surgery is usually needed for permanent treatment. °GET HELP RIGHT AWAY IF:  °· Your pain gets worse. °· Your pain is not helped by medicines. °· You have a fever and lasting symptoms for more than 2-3 days. °· You have a fever and your symptoms suddenly get worse. °· You keep feeling sick to your stomach and throwing up. °MAKE SURE YOU:  °· Understand these instructions. °· Will watch your condition. °· Will get help right away if you are not doing well or get worse. °  °This information is not intended to replace advice given to you by your health care provider. Make sure you discuss any questions you have with your health care provider. °  °Document Released: 12/11/2007 Document Revised: 02/24/2013 Document Reviewed: 12/16/2012 °Elsevier Interactive Patient Education ©2016 [employer] ° °

## 2015-11-15 ENCOUNTER — Encounter (HOSPITAL_COMMUNITY): Payer: Self-pay | Admitting: Emergency Medicine

## 2015-11-15 ENCOUNTER — Emergency Department (HOSPITAL_COMMUNITY)
Admission: EM | Admit: 2015-11-15 | Discharge: 2015-11-15 | Disposition: A | Discharge status: Home / Self Care | Payer: Medicaid Other | Attending: Emergency Medicine | Admitting: Emergency Medicine

## 2015-11-15 ENCOUNTER — Emergency Department (HOSPITAL_COMMUNITY): Payer: Medicaid Other

## 2015-11-15 DIAGNOSIS — R1011 Right upper quadrant pain: Secondary | ICD-10-CM

## 2015-11-15 DIAGNOSIS — Z3202 Encounter for pregnancy test, result negative: Secondary | ICD-10-CM | POA: Insufficient documentation

## 2015-11-15 DIAGNOSIS — Z8679 Personal history of other diseases of the circulatory system: Secondary | ICD-10-CM | POA: Insufficient documentation

## 2015-11-15 DIAGNOSIS — Z79899 Other long term (current) drug therapy: Secondary | ICD-10-CM | POA: Insufficient documentation

## 2015-11-15 DIAGNOSIS — K8020 Calculus of gallbladder without cholecystitis without obstruction: Secondary | ICD-10-CM | POA: Diagnosis not present

## 2015-11-15 HISTORY — DX: Calculus of gallbladder without cholecystitis without obstruction: K80.20

## 2015-11-15 LAB — CBC WITH DIFFERENTIAL/PLATELET
BASOS ABS: 0 10*3/uL (ref 0.0–0.1)
Basophils Relative: 0 %
EOS PCT: 3 %
Eosinophils Absolute: 0.2 10*3/uL (ref 0.0–0.7)
HEMATOCRIT: 37.4 % (ref 36.0–46.0)
HEMOGLOBIN: 12.5 g/dL (ref 12.0–15.0)
LYMPHS ABS: 2.1 10*3/uL (ref 0.7–4.0)
Lymphocytes Relative: 28 %
MCH: 29.5 pg (ref 26.0–34.0)
MCHC: 33.4 g/dL (ref 30.0–36.0)
MCV: 88.2 fL (ref 78.0–100.0)
Monocytes Absolute: 0.7 10*3/uL (ref 0.1–1.0)
Monocytes Relative: 9 %
NEUTROS ABS: 4.4 10*3/uL (ref 1.7–7.7)
NEUTROS PCT: 60 %
PLATELETS: 172 10*3/uL (ref 150–400)
RBC: 4.24 MIL/uL (ref 3.87–5.11)
RDW: 13.4 % (ref 11.5–15.5)
WBC: 7.4 10*3/uL (ref 4.0–10.5)

## 2015-11-15 LAB — URINALYSIS, ROUTINE W REFLEX MICROSCOPIC
Bilirubin Urine: NEGATIVE
GLUCOSE, UA: NEGATIVE mg/dL
Hgb urine dipstick: NEGATIVE
Ketones, ur: NEGATIVE mg/dL
Leukocytes, UA: NEGATIVE
Nitrite: NEGATIVE
PROTEIN: NEGATIVE mg/dL
Specific Gravity, Urine: 1.009 (ref 1.005–1.030)
pH: 5.5 (ref 5.0–8.0)

## 2015-11-15 LAB — COMPREHENSIVE METABOLIC PANEL
ALBUMIN: 3.6 g/dL (ref 3.5–5.0)
ALT: 67 U/L — AB (ref 14–54)
AST: 38 U/L (ref 15–41)
Alkaline Phosphatase: 57 U/L (ref 38–126)
Anion gap: 9 (ref 5–15)
BILIRUBIN TOTAL: 0.8 mg/dL (ref 0.3–1.2)
BUN: 8 mg/dL (ref 6–20)
CO2: 23 mmol/L (ref 22–32)
CREATININE: 0.61 mg/dL (ref 0.44–1.00)
Calcium: 8.8 mg/dL — ABNORMAL LOW (ref 8.9–10.3)
Chloride: 108 mmol/L (ref 101–111)
GFR calc Af Amer: >60 mL/min (ref >60)
GFR calc non Af Amer: >60 mL/min (ref >60)
GLUCOSE: 115 mg/dL — AB (ref 65–99)
POTASSIUM: 3.9 mmol/L (ref 3.5–5.1)
Sodium: 140 mmol/L (ref 135–145)
TOTAL PROTEIN: 7.1 g/dL (ref 6.5–8.1)

## 2015-11-15 LAB — LIPASE, BLOOD: Lipase: 51 U/L (ref 11–51)

## 2015-11-15 LAB — PREGNANCY, URINE: Preg Test, Ur: NEGATIVE

## 2015-11-15 MED ORDER — MORPHINE SULFATE (PF) 4 MG/ML IV SOLN
4.0000 mg | Freq: Once | INTRAVENOUS | Status: AC
  Administered 2015-11-15: 4 mg via INTRAVENOUS
  Filled 2015-11-15: qty 1

## 2015-11-15 MED ORDER — SODIUM CHLORIDE 0.9 % IV BOLUS (SEPSIS)
1000.0000 mL | Freq: Once | INTRAVENOUS | Status: AC
  Administered 2015-11-15: 1000 mL via INTRAVENOUS

## 2015-11-15 MED ORDER — HYDROCODONE-ACETAMINOPHEN 5-325 MG PO TABS: 1.0000 | ORAL_TABLET | ORAL | Status: DC | PRN

## 2015-11-15 MED ORDER — ONDANSETRON HCL 4 MG/2ML IJ SOLN
4.0000 mg | Freq: Once | INTRAMUSCULAR | Status: AC
  Administered 2015-11-15: 4 mg via INTRAVENOUS
  Filled 2015-11-15: qty 2

## 2015-11-15 NOTE — ED Notes (Signed)
Patient transported to Ultrasound 

## 2015-11-15 NOTE — ED Provider Notes (Signed)
CSN: [id number]     Arrival date & time 11/15/15  0504 History   First MD Initiated Contact with Patient 11/15/15 [phone]     Chief Complaint  Patient presents with  . Abdominal Pain     (Consider location/radiation/quality/duration/timing/severity/associated sxs/prior Treatment) HPI   Tiffany Johns is a 35 year old female who presents to the ED today complaining of right upper quadrant abdominal pain nausea. Patient states that she was seen in the ED week ago for similar symptoms and was diagnosed with gallstones. She was sent home with pain medication and was told to follow-up surgery. Patient states that she has since run out of her pain medication and the pain has returned. It is worsened with eating despite her attempt to eat a bland diet. She is associated nausea but has not vomited. Patient states she contacted surgery office but they told her to come back which she had insurance. She states she reapplied for Medicaid 2 days ago and was told it would take 45 days for this to become active. Patient states she cannot control her pain any longer. She denies fevers, chills, hematemesis, diarrhea, jaundice, dysuria.   Past Medical History  Diagnosis Date  . Hypotension   . Cholelithiases    Past Surgical History  Procedure Laterality Date  . Cesarean section  x2  . Esophagogastroduodenoscopy  09/20/2011    Procedure: ESOPHAGOGASTRODUODENOSCOPY (EGD);  Surgeon: Beverley FiedlerJay M Pyrtle, MD;  [facility];  Service: Gastroenterology;  Laterality: N/A;   Family History  Problem Relation Age of Onset  . Anesthesia problems Neg Hx   . Hypotension Neg Hx   . Malignant hyperthermia Neg Hx   . Pseudochol deficiency Neg Hx    Social History  Substance Use Topics  . Smoking status: Never Smoker   . Smokeless tobacco: None  . Alcohol Use: No   OB History    No data available     Review of Systems  All other systems reviewed and are negative.     Allergies  Review of patient's  allergies indicates no known allergies.  Home Medications   Prior to Admission medications   Medication Sig Start Date End Date Taking? Authorizing Provider  HYDROcodone-acetaminophen (NORCO/VICODIN) 5-325 MG tablet Take 1-2 tablets by mouth every 4 (four) hours as needed for moderate pain. 11/09/15  Yes Gilda Creasehristopher J Pollina, MD  ondansetron (ZOFRAN) 4 MG tablet Take 1 tablet (4 mg total) by mouth every 6 (six) hours. 11/09/15  Yes Gilda Creasehristopher J Pollina, MD  ranitidine (ZANTAC) 150 MG tablet Take 1 tablet (150 mg total) by mouth 2 (two) times daily. 11/09/15  Yes Gilda Creasehristopher J Pollina, MD   BP 104/62 mmHg  Pulse 59  Temp(Src) 98.6 F (37 C) (Oral)  Resp 16  SpO2 99%  LMP 10/12/2015 (Exact Date) Physical Exam  Constitutional: She is oriented to person, place, and time. She appears well-developed and well-nourished. No distress.  HENT:  Head: Normocephalic and atraumatic.  Mouth/Throat: No oropharyngeal exudate.  Eyes: Conjunctivae and EOM are normal. Pupils are equal, round, and reactive to light. Right eye exhibits no discharge. Left eye exhibits no discharge. No scleral icterus.  Cardiovascular: Normal rate, regular rhythm, normal heart sounds and intact distal pulses.  Exam reveals no gallop and no friction rub.   No murmur heard. Pulmonary/Chest: Effort normal and breath sounds normal. No respiratory distress. She has no wheezes. She has no rales. She exhibits no tenderness.  Abdominal: Soft. Bowel sounds are normal. She exhibits no distension and no mass.  There is tenderness ( mild RUQ TTP. negative Murphy's sign). There is no rebound and no guarding.  Musculoskeletal: Normal range of motion. She exhibits no edema.  Neurological: She is alert and oriented to person, place, and time.  Skin: Skin is warm and dry. No rash noted. She is not diaphoretic. No erythema. No pallor.  Psychiatric: She has a normal mood and affect. Her behavior is normal.  Nursing note and vitals reviewed.   ED  Course  Procedures (including critical care time) Labs Review Labs Reviewed  COMPREHENSIVE METABOLIC PANEL - Abnormal; Notable for the following:    Glucose, Bld 115 (*)    Calcium 8.8 (*)    ALT 67 (*)    All other components within normal limits  CBC WITH DIFFERENTIAL/PLATELET  LIPASE, BLOOD  URINALYSIS, ROUTINE W REFLEX MICROSCOPIC (NOT AT [facility])  PREGNANCY, URINE  POC URINE PREG, ED    Imaging Review US Abdomen Limited Ruq  11/15/2015  CLINICAL DATA:  Right upper quadrant pain and nausea for 1 week EXAM: US ABDOMEN LIMITED - RIGHT UPPER QUADRANT COMPARISON:  None. FINDINGS: Gallbladder: Sludge and calculi within the gallbladder lumen. Largest calculus measures 11 mm. Borderline gallbladder mural thickening, 3.4 mm. The patient was not tender to transducer pressure over the gallbladder. Common bile duct: Diameter: Normal, 4.3 mm. Liver: No focal lesion identified. Within normal limits in parenchymal echogenicity. IMPRESSION: Cholelithiasis. Borderline mural thickening of the gallbladder. Negative sonographic Murphy's sign. Electronically Signed   By: Ellery Plunk M.D.   On: 11/15/2015 06:51   I have personally reviewed and evaluated these images and lab results as part of my medical decision-making.   EKG Interpretation None      MDM   Final diagnoses:  RUQ abdominal pain    35 year old female presents the ED complaining of persistent right upper quadrant abdominal pain and nausea. She was diagnosed with gallstones a week ago and was sent home with pain medication which she has since run out of. She was unable to follow up with surgery due to lack of insurance. She applied to Medicaid 2 days ago and was told she would have it within 45 days. She appears well in ED, nontoxic nonseptic appearing and in no apparent distress. Her abdomen is soft and mildly tender in the right upper quadrant. All vital signs are stable. [place] of her abdomen shows gallstones but no evidence of  cholecystitis. All lab work within normal limits. I spoke with [facility] surgery's office and they stated that she would still be self-pay without Medicaid and that they do not retroactively pay. They stated that occasionally caseworkers will expedite Medicaid. I attempted to call patient's caseworker but she did not answer. Left voicemail explaining the situation in hopes that the patient's Medicaid will be expedited. Discussed this with the patient that she will need to follow up with her caseworker and ask if they can speed up the process so that she may see a surgeon sooner. Patient expresses complete understanding. Her pain was managed in the ED with IV morphine and Zofran. Will DC with refill on pain medication and appropriate follow-up care.    Lester Kinsman [place], PA-C 11/15/15 1610  Tomasita Crumble, MD 11/15/15 [phone]

## 2015-11-15 NOTE — Discharge Instructions (Signed)
Cholelithiasis Cholelithiasis (also called gallstones) is a form of gallbladder disease. The gallbladder is a small organ that helps you digest fats. Symptoms of gallstones are:  Feeling sick to your stomach (nausea).  Throwing up (vomiting).  Belly pain.  Yellowing of the skin (jaundice).  Sudden pain. You may feel the pain for minutes to hours.  Fever.  Pain to the touch. HOME CARE  Only take medicines as told by your doctor.  Eat a low-fat diet until you see your doctor again. Eating fat can result in pain.  Follow up with your doctor as told. Attacks usually happen time after time. Surgery is usually needed for permanent treatment. GET HELP RIGHT AWAY IF:   Your pain gets worse.  Your pain is not helped by medicines.  You have a fever and lasting symptoms for more than 2-3 days.  You have a fever and your symptoms suddenly get worse.  You keep feeling sick to your stomach and throwing up. MAKE SURE YOU:   Understand these instructions.  Will watch your condition.  Will get help right away if you are not doing well or get worse.   This information is not intended to replace advice given to you by your health care provider. Make sure you discuss any questions you have with your health care provider.  Please follow up with your caseworker for your Medicaid to see if the process can be expedited so that you received her Medicaid sooner. Then, [facility] for reevaluation and possible removal of your gallbladder. Continue eating a bland diet while at home. Avoid fatty or spicy foods. Avoid alcohol. Take pain medication as needed. Return to the ED if you experience severe worsening of your symptoms, fevers, chills, vomiting, diarrhea.

## 2015-11-15 NOTE — ED Notes (Signed)
Pt. reports persistent right upper abdominal pain with mild nauseas onset last week , seen here 11/09/2015 diagnosed with gallstones , denies emesis  or diarrhea , no fever or chills.

## 2015-11-30 ENCOUNTER — Ambulatory Visit: Payer: Self-pay | Admitting: General Surgery

## 2015-11-30 NOTE — H&P (Signed)
History of Present Illness Axel Filler(Adalbert Alberto MD; 11/30/2015 9:48 AM) The patient is a 35 year old female who presents for evaluation of gall stones. The patient is a 35 year old the [place]ali-speaking female who [facility]. Her sister translates for her in the room. Patient states she had right upper quadrant pain over the last 2-3 weeks. She was seen in the ER secondary to this. She was given pain medication which have this resolved. Patient went ultrasound which revealed multiple gallstones and gallbladder. Patient is unsure whether or not she had any high fatty meals prior to her pain.  Patient's LFTs were within normal limits.   Other Problems Robet Leu(Kimberly F Turpin, LPN; 1/61/09605/25/2017 4:549:18 AM) No pertinent past medical history  Past Surgical History Robet Leu(Kimberly F Turpin, LPN; 0/98/11915/25/2017 4:789:18 AM) Resection of Stomach  Diagnostic Studies History Robet Leu(Kimberly F Turpin, LPN; 2/95/62135/25/2017 0:869:18 AM) Colonoscopy never Pap Smear never  Allergies Robet Leu(Kimberly F Turpin, LPN; 5/78/46965/25/2017 2:959:20 AM) No Known Drug Allergies05/25/2017  Medication History Robet Leu(Kimberly F Turpin, LPN; 2/84/13245/25/2017 4:019:20 AM) No Current Medications Medications Reconciled  Family History Robet Leu(Kimberly F Turpin, LPN; 0/27/25365/25/2017 6:449:18 AM) First Degree Relatives No pertinent family history  Pregnancy / Birth History Robet Leu(Kimberly F Turpin, LPN; 0/34/74255/25/2017 9:569:18 AM) Age at menarche 15 years. Gravida 2 Maternal age 35-20 Para 2    Review of Systems Axel Filler(Harshith Pursell MD; 11/30/2015 9:47 AM) General Not Present- Appetite Loss, Chills, Fatigue, Fever, Night Sweats, Weight Gain and Weight Loss. HEENT Not Present- Earache, Hearing Loss, Hoarseness, Nose Bleed, Oral Ulcers, Ringing in the Ears, Seasonal Allergies, Sinus Pain, Sore Throat, Visual Disturbances, Wears glasses/contact lenses and Yellow Eyes. Respiratory Not Present- Cough and Difficulty Breathing. Breast Not Present- Breast Mass,  Breast Pain, Nipple Discharge and Skin Changes. Cardiovascular Not Present- Chest Pain, Difficulty Breathing Lying Down, Leg Cramps, Palpitations, Rapid Heart Rate, Shortness of Breath and Swelling of Extremities. Gastrointestinal Present- Abdominal Pain and Nausea. Female Genitourinary Not Present- Frequency, Nocturia, Painful Urination, Pelvic Pain and Urgency. Musculoskeletal Not Present- Back Pain, Joint Pain, Joint Stiffness, Muscle Pain, Muscle Weakness and Swelling of Extremities. Neurological Not Present- Decreased Memory, Fainting, Headaches, Numbness, Seizures, Tingling, Tremor, Trouble walking and Weakness. Psychiatric Not Present- Anxiety, Bipolar, Change in Sleep Pattern, Depression, Fearful and Frequent crying. Endocrine Not Present- Cold Intolerance, Excessive Hunger, Hair Changes, Heat Intolerance, Hot flashes and New Diabetes. Hematology Not Present- Easy Bruising, Excessive bleeding, Gland problems, HIV and Persistent Infections.  Vitals Cala Bradford(Kimberly F. Turpin LPN; 3/87/56435/25/2017 3:299:19 AM) 11/30/2015 9:19 AM Weight: 120.6 lb Height: 60in Body Surface Area: 1.51 m Body Mass Index: 23.55 kg/m  Temp.: 98.75F(Oral)  Pulse: 72 (Regular)  P.OX: 98% (Room air) BP: 114/76 (Sitting, Left Arm, Standard)       Physical Exam Axel Filler(Alandis Bluemel, MD; 11/30/2015 9:49 AM) General Mental Status-Alert. General Appearance-Consistent with stated age. Hydration-Well hydrated. Voice-Normal.  Head and Neck Head-normocephalic, atraumatic with no lesions or palpable masses.  Eye Eyeball - Bilateral-Extraocular movements intact. Sclera/Conjunctiva - Bilateral-No scleral icterus.  Chest and Lung Exam Chest and lung exam reveals -quiet, even and easy respiratory effort with no use of accessory muscles. Inspection Chest Wall - Normal. Back - normal.  Cardiovascular Cardiovascular examination reveals -normal heart sounds, regular rate and rhythm with no  murmurs.  Abdomen Inspection Normal Exam - No Hernias. Palpation/Percussion Normal exam - Soft, Non Tender, No Rebound tenderness, No Rigidity (guarding) and No hepatosplenomegaly. Auscultation Normal exam - Bowel sounds normal.  Neurologic Neurologic evaluation reveals -  alert and oriented x 3 with no impairment of recent or remote memory. Mental Status-Normal.  Musculoskeletal Normal Exam - Left-Upper Extremity Strength Normal and Lower Extremity Strength Normal. Normal Exam - Right-Upper Extremity Strength Normal, Lower Extremity Weakness.    Assessment & Plan Axel Filler MD; 11/30/2015 9:48 AM) SYMPTOMATIC CHOLELITHIASIS (K80.20) Impression: 35 year old female with symptomatic gallstones. Patient's brother also speaks [place] and would like to be called in regards to scheduling. His number is [phone]  1. We will proceed to the operating room for a laparoscopic cholecystectomy 2. Risks and benefits were discussed with the patient to generally include, but not limited to: infection, bleeding, possible need for post op ERCP, damage to the bile ducts, bile leak, and possible need for furthe

## 2016-01-19 LAB — GLUCOSE, POCT (MANUAL RESULT ENTRY): POC GLUCOSE: 86 mg/dL (ref 70–99)

## 2016-01-24 ENCOUNTER — Ambulatory Visit: Payer: Medicaid Other | Attending: Internal Medicine

## 2016-02-02 ENCOUNTER — Ambulatory Visit: Payer: Medicaid Other | Attending: Family Medicine | Admitting: Family Medicine

## 2016-02-02 ENCOUNTER — Encounter: Payer: Self-pay | Admitting: Family Medicine

## 2016-02-02 VITALS — BP 110/76 | HR 75 | Temp 98.8°F | Resp 17 | Ht 60.0 in | Wt 116.6 lb

## 2016-02-02 DIAGNOSIS — K8020 Calculus of gallbladder without cholecystitis without obstruction: Secondary | ICD-10-CM

## 2016-02-02 DIAGNOSIS — Z114 Encounter for screening for human immunodeficiency virus [HIV]: Secondary | ICD-10-CM

## 2016-02-02 DIAGNOSIS — K8050 Calculus of bile duct without cholangitis or cholecystitis without obstruction: Secondary | ICD-10-CM

## 2016-02-02 LAB — COMPREHENSIVE METABOLIC PANEL
ALT: 17 U/L (ref 6–29)
AST: 22 U/L (ref 10–30)
Albumin: 3.9 g/dL (ref 3.6–5.1)
Alkaline Phosphatase: 54 U/L (ref 33–115)
BUN: 9 mg/dL (ref 7–25)
CHLORIDE: 107 mmol/L (ref 98–110)
CO2: 20 mmol/L (ref 20–31)
CREATININE: 0.65 mg/dL (ref 0.50–1.10)
Calcium: 8.8 mg/dL (ref 8.6–10.2)
Glucose, Bld: 92 mg/dL (ref 65–99)
POTASSIUM: 4.1 mmol/L (ref 3.5–5.3)
SODIUM: 138 mmol/L (ref 135–146)
Total Bilirubin: 0.8 mg/dL (ref 0.2–1.2)
Total Protein: 7 g/dL (ref 6.1–8.1)

## 2016-02-02 LAB — CBC
HEMATOCRIT: 39.7 % (ref 35.0–45.0)
HEMOGLOBIN: 12.8 g/dL (ref 11.7–15.5)
MCH: 28.3 pg (ref 27.0–33.0)
MCHC: 32.2 g/dL (ref 32.0–36.0)
MCV: 87.8 fL (ref 80.0–100.0)
Platelets: 149 10*3/uL (ref 140–400)
RBC: 4.52 MIL/uL (ref 3.80–5.10)
RDW: 13.7 % (ref 11.0–15.0)
WBC: 8.5 10*3/uL (ref 3.8–10.8)

## 2016-02-02 MED ORDER — RANITIDINE HCL 150 MG PO TABS: 150.0000 mg | ORAL_TABLET | Freq: Every day | ORAL | 2 refills | Status: DC

## 2016-02-02 MED ORDER — ACETAMINOPHEN-CODEINE #3 300-30 MG PO TABS: 1.0000 | ORAL_TABLET | Freq: Three times a day (TID) | ORAL | 0 refills | Status: DC | PRN

## 2016-02-02 MED ORDER — RANITIDINE HCL 150 MG PO TABS: 150.0000 mg | ORAL_TABLET | Freq: Two times a day (BID) | ORAL | 0 refills | Status: DC

## 2016-02-02 MED ORDER — ONDANSETRON HCL 4 MG PO TABS: 4.0000 mg | ORAL_TABLET | Freq: Three times a day (TID) | ORAL | 0 refills | Status: DC | PRN

## 2016-02-02 MED ORDER — ONDANSETRON HCL 4 MG PO TABS: 4.0000 mg | ORAL_TABLET | Freq: Four times a day (QID) | ORAL | 0 refills | Status: DC

## 2016-02-02 NOTE — Patient Instructions (Addendum)
Tiffany Johns was seen today for abdominal pain.  Diagnoses and all orders for this visit:  Screening for HIV (human immunodeficiency virus) -     HIV antibody (with reflex)  Gallstones -     Ambulatory referral to General Surgery  Biliary colic -     Comprehensive metabolic panel -     CBC -     acetaminophen-codeine (TYLENOL #3) 300-30 MG tablet; Take 1 tablet by mouth every 8 (eight) hours as needed for moderate pain. -     Discontinue: ondansetron (ZOFRAN) 4 MG tablet; Take 1 tablet (4 mg total) by mouth every 6 (six) hours. -     Discontinue: ranitidine (ZANTAC) 150 MG tablet; Take 1 tablet (150 mg total) by mouth 2 (two) times daily. -     Ambulatory referral to General Surgery -     ranitidine (ZANTAC) 150 MG tablet; Take 1 tablet (150 mg total) by mouth at bedtime. -     ondansetron (ZOFRAN) 4 MG tablet; Take 1 tablet (4 mg total) by mouth every 8 (eight) hours as needed for nausea or vomiting.   Please apply for [place] discount and orange card, you can also inquire if any of your medications are on the PASS (medications assistance) list.    F/u in 2-3 weeks for pap smear   Dr. Armen Pickup  Low-Fat Diet for Pancreatitis or Gallbladder Conditions A low-fat diet can be helpful if you have pancreatitis or a gallbladder condition. With these conditions, your pancreas and gallbladder have trouble digesting fats. A healthy eating plan with less fat will help rest your pancreas and gallbladder and reduce your symptoms. WHAT DO I NEED TO KNOW ABOUT THIS DIET?  Eat a low-fat diet.  Reduce your fat intake to less than 20-30% of your total daily calories. This is less than 50-60 g of fat per day.  Remember that you need some fat in your diet. Ask your dietician what your daily goal should be.  Choose nonfat and low-fat healthy foods. Look for the words "nonfat," "low fat," or "fat free."  As a guide, look on the label and choose foods with less than 3 g of fat per serving. Eat only one  serving.  Avoid alcohol.  Do not smoke. If you need help quitting, talk with your health care provider.  Eat small frequent meals instead of three large heavy meals. WHAT FOODS CAN I EAT? Grains Include healthy grains and starches such as potatoes, wheat bread, fiber-rich cereal, and brown rice. Choose whole grain options whenever possible. In adults, whole grains should account for 45-65% of your daily calories.  Fruits and Vegetables Eat plenty of fruits and vegetables. Fresh fruits and vegetables add fiber to your diet. Meats and Other Protein Sources Eat lean meat such as chicken and pork. Trim any fat off of meat before cooking it. Eggs, fish, and beans are other sources of protein. In adults, these foods should account for 10-35% of your daily calories. Dairy Choose low-fat milk and dairy options. Dairy includes fat and protein, as well as calcium.  Fats and Oils Limit high-fat foods such as fried foods, sweets, baked goods, sugary drinks.  Other Creamy sauces and condiments, such as mayonnaise, can add extra fat. Think about whether or not you need to use them, or use smaller amounts or low fat options. WHAT FOODS ARE NOT RECOMMENDED?  High fat foods, such as:  [employer].  Ice cream.  [place] toast.  Sweet rolls.  Pizza.  Cheese  bread.  Foods covered with batter, butter, creamy sauces, or cheese.  Fried foods.  Sugary drinks and desserts.  Foods that cause gas or bloating   This information is not intended to replace advice given to you by your health care provider. Make sure you discuss any questions you have with your health care provider.   Document Released: 06/29/2013 Document Reviewed: 06/29/2013 Elsevier Interactive Patient Education [employer]

## 2016-02-02 NOTE — Assessment & Plan Note (Addendum)
Biliary colic without cholecystitis on gallbladder US. Today's exam also does not suggest cholecystitis  Plan: Zantac, zofran, tylenol #3 for symptom control CBC, CMP repeat today Diet for biliary tract disease provided  gen surg referral, patient uninsured but advised to apply for CAFA Reviewed s/s to prompt patient to seek emergency care including- fever, persistent emesis and severe abdominal pain

## 2016-02-02 NOTE — Progress Notes (Signed)
LOGO@  Subjective:  Patient ID: Tiffany Johns, female    DOB: 29-Oct-1980  Age: 35 y.o. MRN: [id number]  CC: Abdominal Pain   HPI Tiffany Johns has hx of depression and headaches she presents for    1. Establish care and ED f/u RUQ pain: RUQ since 07/2015.  she was seen in ED on 11/09/15 and 11/15/15 for worsening RUQ pain. She was diagnosed with gallstones without inflammation of the gallbladder. Her liver function test were normal except slight elevated of ALT to 67 on 11/15/15. She was afebrile. She was treated with Vicodin which relieved her pain. She has been evaluated by general surgery on 11/30/15 who is planning lap chole, however this has not been done due to lack of insurance. She now has intermittent RUQ pain with nausea. No fever, chills or emesis.   Past Medical History:  Diagnosis Date  . Cholelithiases   . Hypotension     Past Surgical History:  Procedure Laterality Date  . CESAREAN SECTION  x2  . ESOPHAGOGASTRODUODENOSCOPY  09/20/2011   Procedure: ESOPHAGOGASTRODUODENOSCOPY (EGD);  Surgeon: Beverley Fiedler, MD;  Location: [facility] ENDOSCOPY;  Service: Gastroenterology;  Laterality: N/A;    Family History  Problem Relation Age of Onset  . Anesthesia problems Neg Hx   . Hypotension Neg Hx   . Malignant hyperthermia Neg Hx   . Pseudochol deficiency Neg Hx     Social History  Substance Use Topics  . Smoking status: Never Smoker  . Smokeless tobacco: Not on file  . Alcohol use No    ROS Review of Systems  Constitutional: Negative for chills and fever.  Eyes: Negative for visual disturbance.  Respiratory: Negative for shortness of breath.   Cardiovascular: Negative for chest pain.  Gastrointestinal: Positive for abdominal pain and nausea. Negative for blood in stool and vomiting.  Musculoskeletal: Negative for arthralgias and back pain.  Skin: Negative for rash.  Allergic/Immunologic: Negative for immunocompromised state.  Hematological: Negative for adenopathy. Does not  bruise/bleed easily.  Psychiatric/Behavioral: Negative for dysphoric mood and suicidal ideas.    Objective:   Today's Vitals: BP 110/76 (BP Location: Right Arm, Patient Position: Sitting, Cuff Size: Large)   Pulse 75   Temp 98.8 F (37.1 C) (Oral)   Resp 17   Ht 5' (1.524 m)   Wt 116 lb 9.6 oz (52.9 kg)   LMP 01/13/2016   SpO2 100%   BMI 22.77 kg/m   Physical Exam  Constitutional: She is oriented to person, place, and time. She appears well-developed and well-nourished. No distress.  HENT:  Head: Normocephalic and atraumatic.  Cardiovascular: Normal rate, regular rhythm, normal heart sounds and intact distal pulses.   Pulmonary/Chest: Effort normal and breath sounds normal.  Abdominal: Soft. Bowel sounds are normal. She exhibits no distension and no mass. There is tenderness in the right upper quadrant. There is no rebound, no guarding, no CVA tenderness and negative Murphy's sign.  Musculoskeletal: She exhibits no edema.  Neurological: She is alert and oriented to person, place, and time.  Skin: Skin is warm and dry. No rash noted.  Psychiatric: She has a normal mood and affect.    Assessment & Plan:   Problem List Items Addressed This Visit      Unprioritized   Gallstones   Relevant Orders   Ambulatory referral to General Surgery   Biliary colic   Relevant Medications   ranitidine (ZANTAC) 150 MG tablet   ondansetron (ZOFRAN) 4 MG tablet   acetaminophen-codeine (TYLENOL #3) 300-30 MG  tablet   Other Relevant Orders   Comprehensive metabolic panel   CBC   Ambulatory referral to General Surgery    Other Visit Diagnoses    Screening for HIV (human immunodeficiency virus)    -  Primary   Relevant Orders   HIV antibody (with reflex)      Outpatient Encounter Prescriptions as of 02/02/2016  Medication Sig  . HYDROcodone-acetaminophen (NORCO/VICODIN) 5-325 MG tablet Take 1-2 tablets by mouth every 4 (four) hours as needed for moderate pain.  Marland Kitchen ondansetron (ZOFRAN)  4 MG tablet Take 1 tablet (4 mg total) by mouth every 6 (six) hours.  . ranitidine (ZANTAC) 150 MG tablet Take 1 tablet (150 mg total) by mouth 2 (two) times daily.   No facility-administered encounter medications on file as of 02/02/2016.     Follow-up: No Follow-up on file.    Dessa Phi MD

## 2016-02-03 LAB — HIV ANTIBODY (ROUTINE TESTING W REFLEX): HIV: NONREACTIVE

## 2016-02-08 ENCOUNTER — Telehealth: Payer: Self-pay

## 2016-02-08 NOTE — Telephone Encounter (Signed)
Patient was informed of results on 8/3 

## 2016-02-18 ENCOUNTER — Emergency Department (HOSPITAL_COMMUNITY)
Admission: EM | Admit: 2016-02-18 | Discharge: 2016-02-18 | Disposition: A | Discharge status: Home / Self Care | Payer: Medicaid Other | Attending: Emergency Medicine | Admitting: Emergency Medicine

## 2016-02-18 ENCOUNTER — Encounter (HOSPITAL_COMMUNITY): Payer: Self-pay | Admitting: *Deleted

## 2016-02-18 DIAGNOSIS — Z79899 Other long term (current) drug therapy: Secondary | ICD-10-CM | POA: Insufficient documentation

## 2016-02-18 DIAGNOSIS — K8050 Calculus of bile duct without cholangitis or cholecystitis without obstruction: Secondary | ICD-10-CM | POA: Insufficient documentation

## 2016-02-18 LAB — URINALYSIS, ROUTINE W REFLEX MICROSCOPIC
Bilirubin Urine: NEGATIVE
GLUCOSE, UA: NEGATIVE mg/dL
Hgb urine dipstick: NEGATIVE
KETONES UR: NEGATIVE mg/dL
NITRITE: NEGATIVE
PROTEIN: NEGATIVE mg/dL
Specific Gravity, Urine: 1.016 (ref 1.005–1.030)
pH: 7.5 (ref 5.0–8.0)

## 2016-02-18 LAB — URINE MICROSCOPIC-ADD ON

## 2016-02-18 LAB — CBC
HCT: 39.7 % (ref 36.0–46.0)
Hemoglobin: 12.9 g/dL (ref 12.0–15.0)
MCH: 28.7 pg (ref 26.0–34.0)
MCHC: 32.5 g/dL (ref 30.0–36.0)
MCV: 88.2 fL (ref 78.0–100.0)
PLATELETS: 156 10*3/uL (ref 150–400)
RBC: 4.5 MIL/uL (ref 3.87–5.11)
RDW: 13 % (ref 11.5–15.5)
WBC: 9.7 10*3/uL (ref 4.0–10.5)

## 2016-02-18 LAB — COMPREHENSIVE METABOLIC PANEL
ALK PHOS: 45 U/L (ref 38–126)
ALT: 19 U/L (ref 14–54)
AST: 22 U/L (ref 15–41)
Albumin: 3.5 g/dL (ref 3.5–5.0)
Anion gap: 7 (ref 5–15)
BILIRUBIN TOTAL: 0.5 mg/dL (ref 0.3–1.2)
BUN: 12 mg/dL (ref 6–20)
CALCIUM: 8.6 mg/dL — AB (ref 8.9–10.3)
CHLORIDE: 107 mmol/L (ref 101–111)
CO2: 23 mmol/L (ref 22–32)
CREATININE: 0.58 mg/dL (ref 0.44–1.00)
Glucose, Bld: 103 mg/dL — ABNORMAL HIGH (ref 65–99)
Potassium: 3.9 mmol/L (ref 3.5–5.1)
Sodium: 137 mmol/L (ref 135–145)
TOTAL PROTEIN: 6.5 g/dL (ref 6.5–8.1)

## 2016-02-18 LAB — LIPASE, BLOOD: LIPASE: 45 U/L (ref 11–51)

## 2016-02-18 LAB — POC URINE PREG, ED: PREG TEST UR: NEGATIVE

## 2016-02-18 MED ORDER — HYDROMORPHONE HCL 1 MG/ML IJ SOLN
0.5000 mg | Freq: Once | INTRAMUSCULAR | Status: AC
  Administered 2016-02-18: 0.5 mg via INTRAVENOUS
  Filled 2016-02-18: qty 1

## 2016-02-18 MED ORDER — KETOROLAC TROMETHAMINE 30 MG/ML IJ SOLN
30.0000 mg | Freq: Once | INTRAMUSCULAR | Status: AC
  Administered 2016-02-18: 30 mg via INTRAVENOUS
  Filled 2016-02-18: qty 1

## 2016-02-18 MED ORDER — OXYCODONE-ACETAMINOPHEN 5-325 MG PO TABS: 1.0000 | ORAL_TABLET | ORAL | 0 refills | Status: DC | PRN

## 2016-02-18 NOTE — ED Notes (Signed)
IV removed.

## 2016-02-18 NOTE — ED Triage Notes (Signed)
Pt c/o abd pain for 2 months  She has been seen in the past for the same  And given meds

## 2016-02-18 NOTE — ED Provider Notes (Signed)
[facility]-EMERGENCY DEPT Provider Note   CSN: [id number] Arrival date & time: 02/18/16  7253  First Provider Contact:  None       History   Chief Complaint Chief Complaint  Patient presents with  . Abdominal Pain    HPI Tiffany Johns is a 35 y.o. female.  35 year old female presents with several month history of right upper quadrant abdominal pain. She has a known history of cholelithiasis. Pain became worse last night after she ate food. Has had nausea without vomiting. Has been using Zofran and Pepcid when necessary. No fever or chills. Pain starts in her right upper quadrant posterior epigastrium and through to her back. Symptoms have slowly improved. Patient scheduled to see a general surgeon later this month. Denies any urinary symptoms.      Past Medical History:  Diagnosis Date  . Cholelithiases   . Hypotension     Patient Active Problem List   Diagnosis Date Noted  . Gallstones 02/02/2016  . Biliary colic 02/02/2016  . Headache 07/15/2013    Past Surgical History:  Procedure Laterality Date  . CESAREAN SECTION  x2  . ESOPHAGOGASTRODUODENOSCOPY  09/20/2011   Procedure: ESOPHAGOGASTRODUODENOSCOPY (EGD);  Surgeon: Beverley Fiedler, MD;  Location: [facility] ENDOSCOPY;  Service: Gastroenterology;  Laterality: N/A;    OB History    No data available       Home Medications    Prior to Admission medications   Medication Sig Start Date End Date Taking? Authorizing Provider  acetaminophen-codeine (TYLENOL #3) 300-30 MG tablet Take 1 tablet by mouth every 8 (eight) hours as needed for moderate pain. 02/02/16   Josalyn Funches, MD  ondansetron (ZOFRAN) 4 MG tablet Take 1 tablet (4 mg total) by mouth every 8 (eight) hours as needed for nausea or vomiting. 02/02/16   Dessa Phi, MD  ranitidine (ZANTAC) 150 MG tablet Take 1 tablet (150 mg total) by mouth at bedtime. 02/02/16   Dessa Phi, MD    Family History Family History  Problem Relation Age of Onset  . Anesthesia  problems Neg Hx   . Hypotension Neg Hx   . Malignant hyperthermia Neg Hx   . Pseudochol deficiency Neg Hx     Social History Social History  Substance Use Topics  . Smoking status: Never Smoker  . Smokeless tobacco: Never Used  . Alcohol use No     Allergies   Review of patient's allergies indicates no known allergies.   Review of Systems Review of Systems  All other systems reviewed and are negative.    Physical Exam Updated Vital Signs BP 107/69 (BP Location: Left Arm)   Pulse 76   Temp 97.8 F (36.6 C) (Oral)   Resp 18   Ht  (1.346 m)   Wt 54.4 kg   LMP 02/13/2016   SpO2 100%   BMI 30.04 kg/m   Physical Exam  Constitutional: She is oriented to person, place, and time. She appears well-developed and well-nourished.  Non-toxic appearance. No distress.  HENT:  Head: Normocephalic and atraumatic.  Eyes: Conjunctivae, EOM and lids are normal. Pupils are equal, round, and reactive to light.  Neck: Normal range of motion. Neck supple. No tracheal deviation present. No thyroid mass present.  Cardiovascular: Normal rate, regular rhythm and normal heart sounds.  Exam reveals no gallop.   No murmur heard. Pulmonary/Chest: Effort normal and breath sounds normal. No stridor. No respiratory distress. She has no decreased breath sounds. She has no wheezes. She has no rhonchi. She has  no rales.  Abdominal: Soft. Normal appearance and bowel sounds are normal. She exhibits no distension. There is tenderness in the right upper quadrant. There is no rebound and no CVA tenderness.    Musculoskeletal: Normal range of motion. She exhibits no edema or tenderness.  Neurological: She is alert and oriented to person, place, and time. She has normal strength. No cranial nerve deficit or sensory deficit. GCS eye subscore is 4. GCS verbal subscore is 5. GCS motor subscore is 6.  Skin: Skin is warm and dry. No abrasion and no rash noted.  Psychiatric: She has a normal mood and affect.  Her speech is normal and behavior is normal.  Nursing note and vitals reviewed.    ED Treatments / Results  Labs (all labs ordered are listed, but only abnormal results are displayed) Labs Reviewed  COMPREHENSIVE METABOLIC PANEL - Abnormal; Notable for the following:       Result Value   Glucose, Bld 103 (*)    Calcium 8.6 (*)    All other components within normal limits  URINALYSIS, ROUTINE W REFLEX MICROSCOPIC (NOT AT [facility]) - Abnormal; Notable for the following:    Leukocytes, UA SMALL (*)    All other components within normal limits  URINE MICROSCOPIC-ADD ON - Abnormal; Notable for the following:    Squamous Epithelial / LPF 6-30 (*)    Bacteria, UA MANY (*)    All other components within normal limits  LIPASE, BLOOD  CBC  POC URINE PREG, ED    EKG  EKG Interpretation None       Radiology No results found.  Procedures Procedures (including critical care time)  Medications Ordered in ED Medications  ketorolac (TORADOL) 30 MG/ML injection 30 mg (not administered)  HYDROmorphone (DILAUDID) injection 0.5 mg (not administered)     Initial Impression / Assessment and Plan / ED Course  I have reviewed the triage vital signs and the nursing notes.  Pertinent labs & imaging results that were available during my care of the patient were reviewed by me and considered in my medical decision making (see chart for details).  Clinical Course    Patient medicated here for pain. Labs reviewed and patient likely biliary colic and was instructed to keep or plan with the surgeon  Final Clinical Impressions(s) / ED Diagnoses   Final diagnoses:  None    New Prescriptions New Prescriptions   No medications on file     Lorre NickAnthony Emerie Vanderkolk, MD 02/18/16 [phone]

## 2016-02-27 ENCOUNTER — Ambulatory Visit: Payer: Self-pay | Attending: Family Medicine | Admitting: Family Medicine

## 2016-02-27 ENCOUNTER — Encounter: Payer: Self-pay | Admitting: Family Medicine

## 2016-02-27 VITALS — BP 102/70 | HR 91 | Temp 98.2°F | Wt 115.8 lb

## 2016-02-27 DIAGNOSIS — Z791 Long term (current) use of non-steroidal anti-inflammatories (NSAID): Secondary | ICD-10-CM | POA: Insufficient documentation

## 2016-02-27 DIAGNOSIS — Z79899 Other long term (current) drug therapy: Secondary | ICD-10-CM | POA: Insufficient documentation

## 2016-02-27 DIAGNOSIS — Z79891 Long term (current) use of opiate analgesic: Secondary | ICD-10-CM | POA: Insufficient documentation

## 2016-02-27 DIAGNOSIS — Z124 Encounter for screening for malignant neoplasm of cervix: Secondary | ICD-10-CM | POA: Insufficient documentation

## 2016-02-27 NOTE — Progress Notes (Addendum)
   Subjective:  Patient ID: Tiffany Johns, female    DOB: 03/13/1981  Age: 35 y.o. MRN: [id number]  CC: Gynecologic Exam   HPI Tiffany Johns has hx of gallstones she presents for   1. Screening pap:no pelvic pain or vaginal discharge. No hx of abnormal pap smears.   Social History  Substance Use Topics  . Smoking status: Never Smoker  . Smokeless tobacco: Never Used  . Alcohol use No    Outpatient Medications Prior to Visit  Medication Sig Dispense Refill  . acetaminophen-codeine (TYLENOL #3) 300-30 MG tablet Take 1 tablet by mouth every 8 (eight) hours as needed for moderate pain. 60 tablet 0  . ondansetron (ZOFRAN) 4 MG tablet Take 1 tablet (4 mg total) by mouth every 8 (eight) hours as needed for nausea or vomiting. 30 tablet 0  . oxyCODONE-acetaminophen (PERCOCET/ROXICET) 5-325 MG tablet Take 1-2 tablets by mouth every 4 (four) hours as needed for moderate pain or severe pain. 15 tablet 0  . ranitidine (ZANTAC) 150 MG tablet Take 1 tablet (150 mg total) by mouth at bedtime. 30 tablet 2   No facility-administered medications prior to visit.     ROS Review of Systems  Constitutional: Negative for chills and fever.  Eyes: Negative for visual disturbance.  Respiratory: Negative for shortness of breath.   Cardiovascular: Negative for chest pain.  Gastrointestinal: Negative for abdominal pain, blood in stool, nausea and vomiting.  Musculoskeletal: Negative for arthralgias and back pain.  Skin: Negative for rash.  Allergic/Immunologic: Negative for immunocompromised state.  Hematological: Negative for adenopathy. Does not bruise/bleed easily.  Psychiatric/Behavioral: Negative for dysphoric mood and suicidal ideas.    Objective:  BP 102/70 (BP Location: Right Arm, Patient Position: Sitting, Cuff Size: Small)   Pulse 91   Temp 98.2 F (36.8 C) (Oral)   Wt 115 lb 12.8 oz (52.5 kg)   LMP 02/13/2016   SpO2 99%   BMI 28.98 kg/m   BP/Weight 02/27/2016 02/02/2016 11/15/2015    Systolic BP 102 110 110  Diastolic BP 70 76 77  Wt. (Lbs) 115.8 116.6 -  BMI 28.98 22.77 -  Some encounter information is confidential and restricted. Go to Review Flowsheets activity to see all data.    Physical Exam  Constitutional: She appears well-developed and well-nourished. No distress.  Cardiovascular: Normal rate, regular rhythm, normal heart sounds and intact distal pulses.   Pulmonary/Chest: Effort normal and breath sounds normal.  Genitourinary: Vagina normal and uterus normal. Pelvic exam was performed with patient prone. There is no rash, tenderness or lesion on the right labia. There is no rash, tenderness or lesion on the left labia. Cervix exhibits no motion tenderness, no discharge and no friability.  Musculoskeletal: She exhibits no edema.  Lymphadenopathy:       Right: No inguinal adenopathy present.       Left: No inguinal adenopathy present.  Skin: Skin is warm and dry. No rash noted.     Assessment & Plan:  Tiffany Johns was seen today for gynecologic exam.  Diagnoses and all orders for this visit:  Pap smear for cervical cancer screening -     Cytology - PAP   There are no diagnoses linked to this encounter.  No orders of the defined types were placed in this encounter.   Follow-up: No Follow-up on file.   Dessa PhiJosalyn Daylen Hack MD

## 2016-02-27 NOTE — Patient Instructions (Addendum)
Tiffany Johns was seen today for gynecologic exam.  Diagnoses and all orders for this visit:  Pap smear for cervical cancer screening -     Cytology - PAP   F.u in as needed if you develop pain from gallstones, otherwise in 6 months   Dr. Armen PickupFunches

## 2016-02-28 LAB — CYTOLOGY - PAP

## 2016-02-28 LAB — CERVICOVAGINAL ANCILLARY ONLY: Wet Prep (BD Affirm): NEGATIVE

## 2016-03-04 ENCOUNTER — Telehealth: Payer: Self-pay

## 2016-03-04 NOTE — Telephone Encounter (Signed)
Pt was called and informed of her results.

## 2016-03-06 ENCOUNTER — Emergency Department (HOSPITAL_COMMUNITY): Payer: Medicaid Other

## 2016-03-06 ENCOUNTER — Inpatient Hospital Stay (HOSPITAL_COMMUNITY)
Admission: EM | Admit: 2016-03-06 | Discharge: 2016-03-08 | DRG: 418 | Disposition: A | Discharge status: Home / Self Care | Payer: Medicaid Other | Attending: Internal Medicine | Admitting: Internal Medicine

## 2016-03-06 ENCOUNTER — Encounter (HOSPITAL_COMMUNITY): Payer: Self-pay | Admitting: Emergency Medicine

## 2016-03-06 ENCOUNTER — Inpatient Hospital Stay (HOSPITAL_COMMUNITY): Payer: Medicaid Other

## 2016-03-06 DIAGNOSIS — R109 Unspecified abdominal pain: Secondary | ICD-10-CM | POA: Diagnosis present

## 2016-03-06 DIAGNOSIS — K851 Biliary acute pancreatitis without necrosis or infection: Secondary | ICD-10-CM | POA: Diagnosis present

## 2016-03-06 DIAGNOSIS — Z6830 Body mass index (BMI) 30.0-30.9, adult: Secondary | ICD-10-CM | POA: Diagnosis not present

## 2016-03-06 DIAGNOSIS — Z79899 Other long term (current) drug therapy: Secondary | ICD-10-CM

## 2016-03-06 DIAGNOSIS — E860 Dehydration: Secondary | ICD-10-CM | POA: Diagnosis present

## 2016-03-06 DIAGNOSIS — E669 Obesity, unspecified: Secondary | ICD-10-CM | POA: Diagnosis present

## 2016-03-06 DIAGNOSIS — K8011 Calculus of gallbladder with chronic cholecystitis with obstruction: Secondary | ICD-10-CM | POA: Diagnosis present

## 2016-03-06 DIAGNOSIS — E876 Hypokalemia: Secondary | ICD-10-CM | POA: Diagnosis present

## 2016-03-06 DIAGNOSIS — K859 Acute pancreatitis without necrosis or infection, unspecified: Secondary | ICD-10-CM | POA: Diagnosis present

## 2016-03-06 DIAGNOSIS — Z419 Encounter for procedure for purposes other than remedying health state, unspecified: Secondary | ICD-10-CM

## 2016-03-06 DIAGNOSIS — K8020 Calculus of gallbladder without cholecystitis without obstruction: Secondary | ICD-10-CM | POA: Diagnosis present

## 2016-03-06 DIAGNOSIS — K831 Obstruction of bile duct: Secondary | ICD-10-CM | POA: Diagnosis present

## 2016-03-06 HISTORY — DX: Acute pancreatitis without necrosis or infection, unspecified: K85.90

## 2016-03-06 LAB — COMPREHENSIVE METABOLIC PANEL
ALT: 27 U/L (ref 14–54)
ANION GAP: 5 (ref 5–15)
AST: 43 U/L — ABNORMAL HIGH (ref 15–41)
Albumin: 3.7 g/dL (ref 3.5–5.0)
Alkaline Phosphatase: 52 U/L (ref 38–126)
BUN: 12 mg/dL (ref 6–20)
CO2: 25 mmol/L (ref 22–32)
Calcium: 8.8 mg/dL — ABNORMAL LOW (ref 8.9–10.3)
Chloride: 111 mmol/L (ref 101–111)
Creatinine, Ser: 0.67 mg/dL (ref 0.44–1.00)
GFR calc Af Amer: >60 mL/min (ref >60)
GFR calc non Af Amer: >60 mL/min (ref >60)
GLUCOSE: 122 mg/dL — AB (ref 65–99)
POTASSIUM: 3.4 mmol/L — AB (ref 3.5–5.1)
SODIUM: 141 mmol/L (ref 135–145)
TOTAL PROTEIN: 7 g/dL (ref 6.5–8.1)
Total Bilirubin: 0.8 mg/dL (ref 0.3–1.2)

## 2016-03-06 LAB — CBC
HCT: 36.4 % (ref 36.0–46.0)
Hemoglobin: 12.6 g/dL (ref 12.0–15.0)
MCH: 29.1 pg (ref 26.0–34.0)
MCHC: 34.6 g/dL (ref 30.0–36.0)
MCV: 84.1 fL (ref 78.0–100.0)
PLATELETS: 144 10*3/uL — AB (ref 150–400)
RBC: 4.33 MIL/uL (ref 3.87–5.11)
RDW: 13 % (ref 11.5–15.5)
WBC: 11 10*3/uL — ABNORMAL HIGH (ref 4.0–10.5)

## 2016-03-06 LAB — LIPASE, BLOOD: Lipase: 2068 U/L — ABNORMAL HIGH (ref 11–51)

## 2016-03-06 LAB — I-STAT BETA HCG BLOOD, ED (MC, WL, AP ONLY): I-stat hCG, quantitative: <5 m[IU]/mL (ref <5)

## 2016-03-06 MED ORDER — GADOBENATE DIMEGLUMINE 529 MG/ML IV SOLN
10.0000 mL | Freq: Once | INTRAVENOUS | Status: AC | PRN
  Administered 2016-03-06: 10 mL via INTRAVENOUS

## 2016-03-06 MED ORDER — ONDANSETRON HCL 4 MG/2ML IJ SOLN
4.0000 mg | Freq: Once | INTRAMUSCULAR | Status: AC
  Administered 2016-03-06: 4 mg via INTRAVENOUS
  Filled 2016-03-06: qty 2

## 2016-03-06 MED ORDER — SODIUM CHLORIDE 0.9 % IV BOLUS (SEPSIS)
1000.0000 mL | Freq: Once | INTRAVENOUS | Status: AC
  Administered 2016-03-06: 1000 mL via INTRAVENOUS

## 2016-03-06 MED ORDER — PANTOPRAZOLE SODIUM 40 MG IV SOLR: 40.0000 mg | Freq: Two times a day (BID) | INTRAVENOUS | Status: DC

## 2016-03-06 MED ORDER — HYDROMORPHONE HCL 1 MG/ML IJ SOLN
0.5000 mg | Freq: Once | INTRAMUSCULAR | Status: AC
  Administered 2016-03-06: 0.5 mg via INTRAVENOUS
  Filled 2016-03-06: qty 1

## 2016-03-06 MED ORDER — ONDANSETRON HCL 4 MG/2ML IJ SOLN
4.0000 mg | Freq: Four times a day (QID) | INTRAMUSCULAR | Status: DC | PRN
  Filled 2016-03-06: qty 2

## 2016-03-06 MED ORDER — ONDANSETRON HCL 4 MG PO TABS: 4.0000 mg | ORAL_TABLET | Freq: Four times a day (QID) | ORAL | Status: DC | PRN

## 2016-03-06 MED ORDER — ENOXAPARIN SODIUM 40 MG/0.4ML ~~LOC~~ SOLN
40.0000 mg | SUBCUTANEOUS | Status: DC
  Administered 2016-03-06 – 2016-03-08 (×3): 40 mg via SUBCUTANEOUS
  Filled 2016-03-06 (×3): qty 0.4

## 2016-03-06 MED ORDER — ACETAMINOPHEN 325 MG PO TABS: 650.0000 mg | ORAL_TABLET | Freq: Four times a day (QID) | ORAL | Status: DC | PRN

## 2016-03-06 MED ORDER — MORPHINE SULFATE (PF) 2 MG/ML IV SOLN
1.0000 mg | INTRAVENOUS | Status: DC | PRN
  Administered 2016-03-07 – 2016-03-08 (×3): 1 mg via INTRAVENOUS
  Filled 2016-03-06 (×3): qty 1

## 2016-03-06 MED ORDER — HYDROMORPHONE HCL 1 MG/ML IJ SOLN
1.0000 mg | Freq: Once | INTRAMUSCULAR | Status: AC
  Administered 2016-03-06: 1 mg via INTRAVENOUS
  Filled 2016-03-06: qty 1

## 2016-03-06 MED ORDER — DEXTROSE IN LACTATED RINGERS 5 % IV SOLN
INTRAVENOUS | Status: DC
Start: 2016-03-06 — End: 2016-03-07
  Administered 2016-03-06: 09:00:00 via INTRAVENOUS

## 2016-03-06 MED ORDER — ACETAMINOPHEN 650 MG RE SUPP: 650.0000 mg | Freq: Four times a day (QID) | RECTAL | Status: DC | PRN

## 2016-03-06 NOTE — ED Triage Notes (Signed)
Pt BIB EMS from home c/o abdominal pain that began at 0130 this AM; pt moaning and writhing on arrival; reports vomiting 3-4 times, denies diarrhea; pt is currently on menstrual cycle; denies dysuria; last BM yesterday.

## 2016-03-06 NOTE — ED Notes (Signed)
Prem Darjee's Phone Number (Husband): [phone]

## 2016-03-06 NOTE — H&P (Signed)
History and Physical    Tiffany Johns WUJ:[id number] DOB: 09-26-1980 DOA: 03/06/2016  PCP: Lora Paula, MD   Patient coming from:  Home  Chief Complaint: Abdominal pain.  HPI: Tiffany Johns is a 35 y.o. female with medical history significant of gallstones. Presents with significant abdominal pain, located in the epigastric region and right upper quadrant, 10 out of 10 intensity, colicky in nature, no improving or worsening factors, associated with nausea and vomiting. Due to significant pain she called EMS and she was brought into the hospital for further evaluation.  She has been diagnosed with gallstones recently, she has had 3 visits to the emergency department over last month for biliary colic, she has been referred to the outpatient surgical office to schedule a possible cholecystectomy. Due to insurance issues she has been unable to arrange the appropriate follow-up.  ED Course: Supportive IV fluids, hydromorphone, ultrasonography of the abdomen confirming gallstone near the neck of the gallbladder.  Review of Systems:  1. Gen. Positive for fevers. No weight gain or weight loss. 2. Cardiovascular. No shortness of breath, PND, orthopnea, lower extremity edema. 3. Pulmonary. No shortness of breath cough or hemoptysis 4. Gastrointestinal. Positive for abdominal pain, nausea and vomiting as mentioned in history present illness. 5. Muscular skeletal no joint pain 6. Urology no dysuria or increased urinary frequency 7. Hematology no easy bruisability or frequent infections. 8. Dermatology no rashes 9. Endocrine no tremors heat or cold tolerance 10. Neurology no seizures or paresthesias  Past Medical History:  Diagnosis Date  . Cholelithiases   . Hypotension     Past Surgical History:  Procedure Laterality Date  . CESAREAN SECTION  x2  . ESOPHAGOGASTRODUODENOSCOPY  09/20/2011   Procedure: ESOPHAGOGASTRODUODENOSCOPY (EGD);  Surgeon: Beverley Fiedler, MD;  Location: [facility] ENDOSCOPY;   Service: Gastroenterology;  Laterality: N/A;     reports that she has never smoked. She has never used smokeless tobacco. She reports that she does not drink alcohol or use drugs.  No Known Allergies  Family History  Problem Relation Age of Onset  . Anesthesia problems Neg Hx   . Hypotension Neg Hx   . Malignant hyperthermia Neg Hx   . Pseudochol deficiency Neg Hx     Family history. Denies any history of heart disease cancer diabetes.   Prior to Admission medications   Medication Sig Start Date End Date Taking? Authorizing Provider  ondansetron (ZOFRAN) 4 MG tablet Take 1 tablet (4 mg total) by mouth every 8 (eight) hours as needed for nausea or vomiting. 02/02/16  Yes Dessa Phi, MD  oxyCODONE-acetaminophen (PERCOCET/ROXICET) 5-325 MG tablet Take 1-2 tablets by mouth every 4 (four) hours as needed for moderate pain or severe pain. 02/18/16  Yes Lorre Nick, MD  ranitidine (ZANTAC) 150 MG tablet Take 1 tablet (150 mg total) by mouth at bedtime. 02/02/16  Yes Josalyn Funches, MD  acetaminophen-codeine (TYLENOL #3) 300-30 MG tablet Take 1 tablet by mouth every 8 (eight) hours as needed for moderate pain. Patient not taking: Reported on 03/06/2016 02/02/16   Dessa Phi, MD    Physical Exam: Vitals:   03/06/16 0530 03/06/16 0600 03/06/16 0653 03/06/16 0713  BP: 94/60 95/64 (!) 89/49 103/70  Pulse: 70 78 86 85  Resp:   16 18  Temp:    98.2 F (36.8 C)  TempSrc:    Oral  SpO2: 96% 100% 99% 100%      Constitutional: NAD, calm, comfortable Vitals:   03/06/16 0530 03/06/16 0600 03/06/16 0653 03/06/16 9562  BP: 94/60 95/64 (!) 89/49 103/70  Pulse: 70 78 86 85  Resp:   16 18  Temp:    98.2 F (36.8 C)  TempSrc:    Oral  SpO2: 96% 100% 99% 100%   Eyes: PERRL, lids and conjunctivae, with pelvis but no icterus  ENMT: Mucous membranes are dry. Posterior pharynx clear of any exudate or lesions.Normal dentition.  Neck: normal, supple, no masses, no  thyromegaly Respiratory: clear to auscultation bilaterally, no wheezing, no crackles. Normal respiratory effort. No accessory muscle use.  Cardiovascular: Regular rate and rhythm, no murmurs / rubs / gallops. No extremity edema. 2+ pedal pulses. No carotid bruits.  Abdomen: positive tenderness to superficial and deep palpation in the epigastric region, , no masses palpated. No hepatosplenomegaly. Bowel sounds decreased. No rebound, negative murphy sign.  Musculoskeletal: no clubbing / cyanosis. No joint deformity upper and lower extremities. Good ROM, no contractures. Normal muscle tone.  Skin: no rashes, lesions, ulcers. No induration Neurologic: CN 2-12 grossly intact. Sensation intact, DTR normal. Strength 5/5 in all 4.  Psychiatric: Normal judgment and insight. Alert and oriented x 3. Normal mood.    Labs on Admission: I have personally reviewed following labs and imaging studies  CBC:  Recent Labs Lab 03/06/16 0443  WBC 11.0*  HGB 12.6  HCT 36.4  MCV 84.1  PLT 144*   Basic Metabolic Panel:  Recent Labs Lab 03/06/16 0443  NA 141  K 3.4*  CL 111  CO2 25  GLUCOSE 122*  BUN 12  CREATININE 0.67  CALCIUM 8.8*   GFR: Estimated Creatinine Clearance: 59.8 mL/min (by C-G formula based on SCr of 0.8 mg/dL). Liver Function Tests:  Recent Labs Lab 03/06/16 0443  AST 43*  ALT 27  ALKPHOS 52  BILITOT 0.8  PROT 7.0  ALBUMIN 3.7    Recent Labs Lab 03/06/16 0443  LIPASE 2,068*   No results for input(s): AMMONIA in the last 168 hours. Coagulation Profile: No results for input(s): INR, PROTIME in the last 168 hours. Cardiac Enzymes: No results for input(s): CKTOTAL, CKMB, CKMBINDEX, TROPONINI in the last 168 hours. BNP (last 3 results) No results for input(s): PROBNP in the last 8760 hours. HbA1C: No results for input(s): HGBA1C in the last 72 hours. CBG: No results for input(s): GLUCAP in the last 168 hours. Lipid Profile: No results for input(s): CHOL, HDL,  LDLCALC, TRIG, CHOLHDL, LDLDIRECT in the last 72 hours. Thyroid Function Tests: No results for input(s): TSH, T4TOTAL, FREET4, T3FREE, THYROIDAB in the last 72 hours. Anemia Panel: No results for input(s): VITAMINB12, FOLATE, FERRITIN, TIBC, IRON, RETICCTPCT in the last 72 hours. Urine analysis:    Component Value Date/Time   COLORURINE YELLOW 02/18/2016 0442   APPEARANCEUR CLEAR 02/18/2016 0442   LABSPEC 1.016 02/18/2016 0442   PHURINE 7.5 02/18/2016 0442   GLUCOSEU NEGATIVE 02/18/2016 0442   HGBUR NEGATIVE 02/18/2016 0442   BILIRUBINUR NEGATIVE 02/18/2016 0442   KETONESUR NEGATIVE 02/18/2016 0442   PROTEINUR NEGATIVE 02/18/2016 0442   UROBILINOGEN 0.2 02/12/2010 1150   NITRITE NEGATIVE 02/18/2016 0442   LEUKOCYTESUR SMALL (A) 02/18/2016 0442   Sepsis Labs: !!!!!!!!!!!!!!!!!!!!!!!!!!!!!!!!!!!!!!!!!!!! @LABRCNTIP (procalcitonin:4,lacticidven:4) )No results found for this or any previous visit (from the past 240 hour(s)).   Radiological Exams on Admission: US Abdomen Limited  Result Date: 03/06/2016 CLINICAL DATA:  Sudden onset right upper quadrant pain EXAM: US ABDOMEN LIMITED - RIGHT UPPER QUADRANT COMPARISON:  Abdominal ultrasound 11/15/2015 FINDINGS: Gallbladder: There is a 1.2 cm stone in shows limited mobility, near the  gallbladder neck. Small gravel like stones are also seen with the patient in left lateral decubitus position. There is no gallbladder wall thickening or pericholecystic fluid. No positive sonographic Murphy sign was demonstrated by the sonEulah Pontographer. 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 Common bile duct: Diameter: 6.5 mm proximally, 4.6 mm distally Liver: Increased echogenicity may indicate hepatic steatosis. No focal lesion. IMPRESSION: 1. Cholelithiasis without other evidence of acute cholecystitis. 2. 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  Electronically Signed   By: Deatra RobinsonKevin  Herman M.D.   On: 03/06/2016 05:44    EKG: NA  Assessment/Plan Active Problems:   Acute pancreatitis   Pancreatitis due to biliary obstruction   This is a 35 year old female with no significant past medical history besides gallstones who presents to hospital with chief complaint of worsening abdominal pain. She has been evaluated in the past in emergency department for biliary colic. She has been unable to arrange outpatient follow-up with the surgical service. Patient's initial blood pressure was 98/71, the lowest measurement was 89/49. Her abdominal pain is mainly localized in the epigastric region and right upper quadrant. There is no rebound, Murphy's sign is negative. Her serum sodium is 141, potassium 3.5, chloride 111, creatinine 0.67, with a BUN of 12. Her lipase is significantly elevated at  2,068, white count is 11, hemoglobin 12.6, hematocrit 36.4, platelet count of 144. Ultrasonography of the abdomen shows 1.2 cm stone near the gallbladder neck, no gallbladder wall thickening or pericholecystic fluid.   Working diagnosis. Acute abdominal pain due to gallstone pancreatitis, complicated by hypotension.  1. Gallstone pancreatitis. We'll continue supportive care with IV fluids, lactated Ringer's with dextrose, will keep patient nothing by mouth except ice chips, pain control with morphine intravenously, antiacid therapy with pantoprazole and antiemetics with Zofran. There is no signs of significant common bile duct dilatation, considering diagnosis of pancreatitis patient will need further imaging with MRCP or ERCP, gastroenterology will be consulted.  2. Hypotension. Blood pressure has improved with IV fluids. We'll continue administration of dextrose with lactated Ringer's at 100 mils per hour. Would re-bolused with normal saline if needed to target a mean arterial pressure more 65. We'll have caution not to produce volume overload.  3. Hypokalemia.  Probably due to dehydration. Patient will be hydrated with lactated Ringer's at 100 ml per hour and continue to follow kidney function and electrolytes. Avoid hypotension or nephrorotoxic agents.  4. Gallstones. At this point no signs of cholecystitis, will continue close monitor patient's cell count and temperature curve. For now hold on antibiotic therapy. Surgical service will need to be consulted for eventual cholecystectomy on this admission.  5. Patient is at high risk of developing worsening gallstone pancreatitis.   DVT prophylaxis:  Lovenox Code Status: Full Family Communication: No family at the bedside. Disposition Plan: home Consults called: Gastroenterology and Surgery Admission status: Inpatient.   Mauricio Annett Gulaaniel Arrien MD [facility] Pager [phone]  If 7PM-7AM, please contact night-coverage [email or web address] Password [email or web address]  03/06/2016, 7:51 AM

## 2016-03-06 NOTE — Consult Note (Signed)
[facility] Surgery Consult Note  Tiffany Johns May 23, 1981  [id number].    Requesting MD: Sander Radon, MD Chief Complaint/Reason for Consult: Gallstone pancreatitis  HPI:  Tiffany Johns is a 35yo Nepali-speaking female who presented to the [facility] earlier this morning with severe colicky epigastric and right upper quadrant pain. States that the pain began in May 2017 but gradually got worse. Unsure if she had any high fatty meals prior to her pain.She has been seen in the ED 3 times over the last month for biliary colic. Due to lack of insurance she was unable to schedule appointment with outpatient surgical office. Her pain was severe and 10/10 this morning. She reports associated nausea and vomiting. Denies abnormal bowel habits, diarrhea, hematochezia, melena, dysuria, or fevers. She tried Zantac, Zofran, and Percocet but this did not help her pain.  Last meal was yesterday evening.  No significant PMH PSH includes 2 C. Sections  Nonsmoker Does not drink alcohol Has not been able to work for several months due to pain. Previously worked in [place]: All systems reviewed and otherwise negative except for as above  Family History  Problem Relation Age of Onset  . Anesthesia problems Neg Hx   . Hypotension Neg Hx   . Malignant hyperthermia Neg Hx   . Pseudochol deficiency Neg Hx     Past Medical History:  Diagnosis Date  . Cholelithiases   . Hypotension   . Pancreatitis     Past Surgical History:  Procedure Laterality Date  . CESAREAN SECTION  x2  . ESOPHAGOGASTRODUODENOSCOPY  09/20/2011   Procedure: ESOPHAGOGASTRODUODENOSCOPY (EGD);  Surgeon: Jerene Bears, MD;  Location: [place];  Service: Gastroenterology;  Laterality: N/A;    Social History:  reports that she has never smoked. She has never used smokeless tobacco. She reports that she does not drink alcohol or use drugs.  Allergies: No Known Allergies  Medications Prior to Admission  Medication  Sig Dispense Refill  . ondansetron (ZOFRAN) 4 MG tablet Take 1 tablet (4 mg total) by mouth every 8 (eight) hours as needed for nausea or vomiting. 30 tablet 0  . oxyCODONE-acetaminophen (PERCOCET/ROXICET) 5-325 MG tablet Take 1-2 tablets by mouth every 4 (four) hours as needed for moderate pain or severe pain. 15 tablet 0  . ranitidine (ZANTAC) 150 MG tablet Take 1 tablet (150 mg total) by mouth at bedtime. 30 tablet 2  . acetaminophen-codeine (TYLENOL #3) 300-30 MG tablet Take 1 tablet by mouth every 8 (eight) hours as needed for moderate pain. (Patient not taking: Reported on 03/06/2016) 60 tablet 0    Blood pressure 103/70, pulse 85, temperature 98.2 F (36.8 C), temperature source Oral, resp. rate 18, last menstrual period 03/06/2016, SpO2 100 %. Physical Exam: General: pleasant, WD/WN female who is laying in bed in NAD HEENT: head is normocephalic, atraumatic.  Mouth is pink and moist Heart: regular, rate, and rhythm.  No obvious murmurs, gallops, or rubs noted.  Palpable pedal pulses bilaterally Lungs: CTAB, no wheezes, rhonchi, or rales noted.  Respiratory effort nonlabored Abd: soft, ND, +BS, no masses, hernias, or organomegaly. Very tender RUQ>RLQ MS: all 4 extremities are symmetrical with no cyanosis, clubbing, or edema. Skin: warm and dry with no masses, lesions, or rashes Psych: A&Ox3 with an appropriate affect.  Results for orders placed or performed during the hospital encounter of 03/06/16 (from the past 48 hour(s))  CBC     Status: Abnormal   Collection Time: 03/06/16  4:43 AM  Result Value  Ref Range   WBC 11.0 (H) 4.0 - 10.5 K/uL   RBC 4.33 3.87 - 5.11 MIL/uL   Hemoglobin 12.6 12.0 - 15.0 g/dL   HCT 36.4 36.0 - 46.0 %   MCV 84.1 78.0 - 100.0 fL   MCH 29.1 26.0 - 34.0 pg   MCHC 34.6 30.0 - 36.0 g/dL   RDW 13.0 11.5 - 15.5 %   Platelets 144 (L) 150 - 400 K/uL  Comprehensive metabolic panel     Status: Abnormal   Collection Time: 03/06/16  4:43 AM  Result Value Ref  Range   Sodium 141 135 - 145 mmol/L   Potassium 3.4 (L) 3.5 - 5.1 mmol/L   Chloride 111 101 - 111 mmol/L   CO2 25 22 - 32 mmol/L   Glucose, Bld 122 (H) 65 - 99 mg/dL   BUN 12 6 - 20 mg/dL   Creatinine, Ser 0.67 0.44 - 1.00 mg/dL   Calcium 8.8 (L) 8.9 - 10.3 mg/dL   Total Protein 7.0 6.5 - 8.1 g/dL   Albumin 3.7 3.5 - 5.0 g/dL   AST 43 (H) 15 - 41 U/L   ALT 27 14 - 54 U/L   Alkaline Phosphatase 52 38 - 126 U/L   Total Bilirubin 0.8 0.3 - 1.2 mg/dL   GFR calc non Af Amer >60 >60 mL/min   GFR calc Af Amer >60 >60 mL/min    Comment: (NOTE) The eGFR has been calculated using the CKD EPI equation. This calculation has not been validated in all clinical situations. eGFR's persistently <60 mL/min signify possible Chronic Kidney Disease.    Anion gap 5 5 - 15  Lipase, blood     Status: Abnormal   Collection Time: 03/06/16  4:43 AM  Result Value Ref Range   Lipase 2,068 (H) 11 - 51 U/L    Comment: RESULTS CONFIRMED BY MANUAL DILUTION  I-Stat Beta hCG blood, ED ([facility], [facility], [facility] only)     Status: None   Collection Time: 03/06/16  4:57 AM  Result Value Ref Range   I-stat hCG, quantitative <5.0 <5 mIU/mL   Comment 3            Comment:   GEST. AGE      CONC.  (mIU/mL)   <=1 WEEK        5 - 50     2 WEEKS       50 - 500     3 WEEKS       100 - 10,000     4 WEEKS     1,000 - 30,000        FEMALE AND NON-PREGNANT FEMALE:     LESS THAN 5 mIU/mL    Mr 3d Recon At Scanner  Result Date: 03/06/2016 CLINICAL DATA:  Pancreatitis secondary to biliary obstruction. Severe right-sided abdominal pain. EXAM: MRI ABDOMEN WITHOUT AND WITH CONTRAST (INCLUDING MRCP) TECHNIQUE: Multiplanar multisequence MR imaging of the abdomen was performed both before and after the administration of intravenous contrast. Heavily T2-weighted images of the biliary and pancreatic ducts were obtained, and three-dimensional MRCP images were rendered by post processing. CONTRAST:  75m MULTIHANCE GADOBENATE DIMEGLUMINE 529 MG/ML IV  SOLN COMPARISON:  Ultrasound of earlier today. FINDINGS: Portions of exam are minimally motion degraded. Lower chest: Normal heart size without pericardial or pleural effusion. Hepatobiliary: Normal liver. Isolated 1.3 cm gallstone within the mid gallbladder. No specific evidence of acute cholecystitis. Intrahepatic ducts are upper normal, including on image 14/series 3. The common  duct is minimally dilated for patient age. Measures on the order of 7 mm on image 17/ series 3, image 45/ series 4. Upper normal in this age group 6 mm. No choledocholithiasis. No obstructive mass. Tapers gradually more distally and is followed to the level of the ampulla. Pancreas: Enhances normally. No duct dilatation. Subtle edema within the anterior pararenal space, including on image 20/series 3 and image 22/series 3. Spleen: Normal in size, without focal abnormality. Adrenals/Urinary Tract: Normal adrenal glands. Normal kidneys, without hydronephrosis. Stomach/Bowel: Normal stomach and abdominal bowel loops. Vascular/Lymphatic: Normal caliber of the aorta and branch vessels. No retroperitoneal or retrocrural adenopathy. Other: No ascites. Musculoskeletal: No acute osseous abnormality. IMPRESSION: 1. Cholelithiasis. 2. Borderline to minimal biliary duct dilatation, without choledocholithiasis or obstructive mass. Given the normal bilirubin level of earlier today, most likely within normal variation. 3. Subtle findings which likely relate to the clinical history of pancreatitis. Electronically Signed   By: Abigail Miyamoto M.D.   On: 03/06/2016 12:06   US Abdomen Limited  Result Date: 03/06/2016 CLINICAL DATA:  Sudden onset right upper quadrant pain EXAM: US ABDOMEN LIMITED - RIGHT UPPER QUADRANT COMPARISON:  Abdominal ultrasound 11/15/2015 FINDINGS: Gallbladder: There is a 1.2 cm stone in shows limited mobility, near the gallbladder neck. Small gravel like stones are also seen with the patient in left lateral decubitus position.  There is no gallbladder wall thickening or pericholecystic fluid. No positive sonographic Percell Miller sign was demonstrated by the sonographer. 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 Common bile duct: Diameter: 6.5 mm proximally, 4.6 mm distally Liver: Increased echogenicity may indicate hepatic steatosis. No focal lesion. IMPRESSION: 1. Cholelithiasis without other evidence of acute cholecystitis. 2. 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 Electronically Signed   By: Ulyses Jarred M.D.   On: 03/06/2016 05:44   Mr Jeananne Rama W/wo Cm/mrcp  Result Date: 03/06/2016 CLINICAL DATA:  Pancreatitis secondary to biliary obstruction. Severe right-sided abdominal pain. EXAM: MRI ABDOMEN WITHOUT AND WITH CONTRAST (INCLUDING MRCP) TECHNIQUE: Multiplanar multisequence MR imaging of the abdomen was performed both before and after the administration of intravenous contrast. Heavily T2-weighted images of the biliary and pancreatic ducts were obtained, and three-dimensional MRCP images were rendered by post processing. CONTRAST:  51m MULTIHANCE GADOBENATE DIMEGLUMINE 529 MG/ML IV SOLN COMPARISON:  Ultrasound of earlier today. FINDINGS: Portions of exam are minimally motion degraded. Lower chest: Normal heart size without pericardial or pleural effusion. Hepatobiliary: Normal liver. Isolated 1.3 cm gallstone within the mid gallbladder. No specific evidence of acute cholecystitis. Intrahepatic ducts are upper normal, including on image 14/series 3. The common duct is minimally dilated for patient age. Measures on the order of 7 mm on image 17/ series 3, image 45/ series 4. Upper normal in this age group 6 mm. No choledocholithiasis. No obstructive mass. Tapers gradually more distally and is followed to the level of the ampulla. Pancreas: Enhances normally. No duct dilatation. Subtle edema within the anterior pararenal  space, including on image 20/series 3 and image 22/series 3. Spleen: Normal in size, without focal abnormality. Adrenals/Urinary Tract: Normal adrenal glands. Normal kidneys, without hydronephrosis. Stomach/Bowel: Normal stomach and abdominal bowel loops. Vascular/Lymphatic: Normal caliber of the aorta and branch vessels. No retroperitoneal or retrocrural adenopathy. Other: No ascites. Musculoskeletal: No acute osseous abnormality. IMPRESSION: 1. Cholelithiasis. 2. Borderline to minimal biliary duct dilatation, without choledocholithiasis or obstructive mass. Given the normal bilirubin level of earlier today, most likely within  normal variation. 3. Subtle findings which likely relate to the clinical history of pancreatitis. Electronically Signed   By: Abigail Miyamoto M.D.   On: 03/06/2016 12:06      Assessment/Plan Gallstone pancreatitis - MRCP showed without choledocholithiasis or obstructive mass - U/s showed Cholelithiasis without other evidence of acute cholecystitis - elevated lipase 2068 today. Near normal LFT's (AST 43, ALT 27, bilirubin 0.8) - will likely need cholecystectomy with IOC at some point this admission - recheck lipase and LFT's tomorrow FEN - NPO except ice chips, IVF VTE - lovenox, SCD's ID - none Plan - will continue to follow pain and lipase level. Plan OR Friday 03/08/16 unless pain not improving or lipase not trending down  Jerrye Beavers, [facility] Surgery 03/06/2016, 2:42 PM Pager: [phone] Consults: [phone] Mon-Fri 7:00 am-4:30 pm Sat-Sun 7:00 am-11:30 am

## 2016-03-06 NOTE — ED Notes (Signed)
5E called for report; charge nurse stated she would call back

## 2016-03-06 NOTE — ED Provider Notes (Signed)
[facility]-EMERGENCY DEPT Provider Note   CSN: [id number] Arrival date & time: 03/06/16  0355     History   Chief Complaint Chief Complaint  Patient presents with  . Abdominal Pain    HPI Tiffany Johns is a 35 y.o. female.  35 year old female presents to the emergency department for evaluation of epigastric and right upper quadrant abdominal pain which awoke her from sleep at approximately 2 AM today. Patient with nausea and vomiting. She has vomited approximately 3-4 times prior to ED evaluation. She has not had any diarrhea. Reports normal bowel movements recently. She has tried Zantac, Zofran, and Percocet for pain without relief. She has known history of gallstones and has presented to the emergency department on multiple occasions for complaints of similar pain. No recent fevers or urinary symptoms. Abdominal surgical history significant for cesarean section.   The history is provided by the patient. No language interpreter was used.  Abdominal Pain   Associated symptoms include nausea and vomiting.    Past Medical History:  Diagnosis Date  . Cholelithiases   . Hypotension     Patient Active Problem List   Diagnosis Date Noted  . Gallstones 02/02/2016  . Biliary colic 02/02/2016  . Headache 07/15/2013    Past Surgical History:  Procedure Laterality Date  . CESAREAN SECTION  x2  . ESOPHAGOGASTRODUODENOSCOPY  09/20/2011   Procedure: ESOPHAGOGASTRODUODENOSCOPY (EGD);  Surgeon: Beverley Fiedler, MD;  Location: [facility] ENDOSCOPY;  Service: Gastroenterology;  Laterality: N/A;    OB History    No data available       Home Medications    Prior to Admission medications   Medication Sig Start Date End Date Taking? Authorizing Provider  ondansetron (ZOFRAN) 4 MG tablet Take 1 tablet (4 mg total) by mouth every 8 (eight) hours as needed for nausea or vomiting. 02/02/16  Yes Dessa Phi, MD  oxyCODONE-acetaminophen (PERCOCET/ROXICET) 5-325 MG tablet Take 1-2 tablets by mouth every  4 (four) hours as needed for moderate pain or severe pain. 02/18/16  Yes Lorre Nick, MD  ranitidine (ZANTAC) 150 MG tablet Take 1 tablet (150 mg total) by mouth at bedtime. 02/02/16  Yes Josalyn Funches, MD  acetaminophen-codeine (TYLENOL #3) 300-30 MG tablet Take 1 tablet by mouth every 8 (eight) hours as needed for moderate pain. Patient not taking: Reported on 03/06/2016 02/02/16   Dessa Phi, MD    Family History Family History  Problem Relation Age of Onset  . Anesthesia problems Neg Hx   . Hypotension Neg Hx   . Malignant hyperthermia Neg Hx   . Pseudochol deficiency Neg Hx     Social History Social History  Substance Use Topics  . Smoking status: Never Smoker  . Smokeless tobacco: Never Used  . Alcohol use No     Allergies   Review of patient's allergies indicates no known allergies.   Review of Systems Review of Systems  Gastrointestinal: Positive for abdominal pain, nausea and vomiting.  Ten systems reviewed and are negative for acute change, except as noted in the HPI.     Physical Exam Updated Vital Signs BP 94/60   Pulse 70   Temp 97.7 F (36.5 C) (Oral)   Resp 18   LMP 03/06/2016   SpO2 96%   Physical Exam  Constitutional: She is oriented to person, place, and time. She appears well-developed and well-nourished. No distress.  Nontoxic appearing; whining, uncomfortable.  HENT:  Head: Normocephalic and atraumatic.  Eyes: Conjunctivae and EOM are normal. No scleral icterus.  Neck: Normal range of motion.  Cardiovascular: Normal rate, regular rhythm and intact distal pulses.   Pulmonary/Chest: Effort normal. No respiratory distress.  Respirations even and unlabored  Abdominal: Soft. She exhibits no distension. There is tenderness. There is guarding.  TTP in RUQ with voluntary guarding. No masses. Abdomen soft. No peritoneal signs.  Musculoskeletal: Normal range of motion.  Neurological: She is alert and oriented to person, place, and time.  GCS  15. Patient moving all extremities.  Skin: Skin is warm and dry. No rash noted. She is not diaphoretic. No erythema. No pallor.  Psychiatric: She has a normal mood and affect. Her behavior is normal.  Nursing note and vitals reviewed.    ED Treatments / Results  Labs (all labs ordered are listed, but only abnormal results are displayed) Labs Reviewed  CBC - Abnormal; Notable for the following:       Result Value   WBC 11.0 (*)    Platelets 144 (*)    All other components within normal limits  COMPREHENSIVE METABOLIC PANEL - Abnormal; Notable for the following:    Potassium 3.4 (*)    Glucose, Bld 122 (*)    Calcium 8.8 (*)    AST 43 (*)    All other components within normal limits  LIPASE, BLOOD - Abnormal; Notable for the following:    Lipase 2,068 (*)    All other components within normal limits  I-STAT BETA HCG BLOOD, ED ([facility], [facility], [facility] ONLY)    EKG  EKG Interpretation None       Radiology [place] Abdomen Limited  Result Date: 03/06/2016 CLINICAL DATA:  Sudden onset right upper quadrant pain EXAM: US ABDOMEN LIMITED - RIGHT UPPER QUADRANT COMPARISON:  Abdominal ultrasound 11/15/2015 FINDINGS: Gallbladder: There is a 1.2 cm stone in shows limited mobility, near the gallbladder neck. Small gravel like stones are also seen with the patient in left lateral decubitus position. There is no gallbladder wall thickening or pericholecystic fluid. No positive sonographic Eulah PontMurphy sign was demonstrated by the sonographer. 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 Common bile duct: Diameter: 6.5 mm proximally, 4.6 mm distally Liver: Increased echogenicity may indicate hepatic steatosis. No focal lesion. IMPRESSION: 1. Cholelithiasis without other evidence of acute cholecystitis. 2. 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 Electronically Signed   By: Deatra RobinsonKevin  Herman M.D.   On:  03/06/2016 05:44    Procedures Procedures (including critical care time)  Medications Ordered in ED Medications  sodium chloride 0.9 % bolus 1,000 mL (1,000 mLs Intravenous New Bag/Given 03/06/16 0559)  HYDROmorphone (DILAUDID) injection 0.5 mg (not administered)  ondansetron (ZOFRAN) injection 4 mg (4 mg Intravenous Given 03/06/16 0456)  HYDROmorphone (DILAUDID) injection 1 mg (1 mg Intravenous Given 03/06/16 0456)     Initial Impression / Assessment and Plan / ED Course  I have reviewed the triage vital signs and the nursing notes.  Pertinent labs & imaging results that were available during my care of the patient were reviewed by me and considered in my medical decision making (see chart for details).  Clinical Course    Patient presenting to the ED for acute onset of epigastric and RUQ pain with N/V similar to past episodes of biliary colic. Patient was recently seen for similar symptoms on 02/18/2016 and discharged with a course of pain medicine. Patient took Percocet, Zofran, and ranitidine prior to arrival without any relief of her pain. She was  focally tender in her epigastric region and her right upper quadrant.  Patient afebrile with mild leukocytosis. LFTs are reassuring and ultrasound does not show evidence of acute cholecystitis. Patient does have cholelithiasis on ultrasound. Her lipase was >2000.  CT imaging was with held given that it would likely not change the patient's management. Her pain has been better controlled with Dilaudid. I do think she would benefit from an ERCP given findings concerning for gallstone pancreatitis. Case discussed with Dr. Maryfrances Bunnell; [facility] to admit.   Final Clinical Impressions(s) / ED Diagnoses   Final diagnoses:  Acute gallstone pancreatitis    New Prescriptions New Prescriptions   No medications on file     Antony Madura, Cordelia Poche 03/06/16 1610    April Palumbo, MD 03/06/16 [phone]

## 2016-03-06 NOTE — ED Notes (Signed)
Bed: WA18 Expected date:  Expected time:  Means of arrival:  Comments: Abdominal pain  

## 2016-03-06 NOTE — Progress Notes (Signed)
Attempted to call RN for report, no answer. Will call back.

## 2016-03-06 NOTE — ED Notes (Signed)
PA at bedside.

## 2016-03-07 ENCOUNTER — Inpatient Hospital Stay (HOSPITAL_COMMUNITY): Payer: Medicaid Other | Admitting: Certified Registered Nurse Anesthetist

## 2016-03-07 ENCOUNTER — Encounter (HOSPITAL_COMMUNITY): Payer: Self-pay | Admitting: Certified Registered Nurse Anesthetist

## 2016-03-07 ENCOUNTER — Encounter (HOSPITAL_COMMUNITY)
Admission: EM | Disposition: A | Discharge status: Home / Self Care | Payer: Self-pay | Source: Home / Self Care | Attending: Internal Medicine

## 2016-03-07 ENCOUNTER — Inpatient Hospital Stay (HOSPITAL_COMMUNITY): Payer: Medicaid Other

## 2016-03-07 DIAGNOSIS — K8020 Calculus of gallbladder without cholecystitis without obstruction: Secondary | ICD-10-CM

## 2016-03-07 DIAGNOSIS — K861 Other chronic pancreatitis: Secondary | ICD-10-CM

## 2016-03-07 HISTORY — PX: CHOLECYSTECTOMY: SHX55

## 2016-03-07 LAB — COMPREHENSIVE METABOLIC PANEL
ALT: 44 U/L (ref 14–54)
ANION GAP: 4 — AB (ref 5–15)
AST: 36 U/L (ref 15–41)
Albumin: 3.2 g/dL — ABNORMAL LOW (ref 3.5–5.0)
Alkaline Phosphatase: 50 U/L (ref 38–126)
BUN: 6 mg/dL (ref 6–20)
CHLORIDE: 111 mmol/L (ref 101–111)
CO2: 25 mmol/L (ref 22–32)
Calcium: 8.4 mg/dL — ABNORMAL LOW (ref 8.9–10.3)
Creatinine, Ser: 0.52 mg/dL (ref 0.44–1.00)
GFR calc non Af Amer: >60 mL/min (ref >60)
Glucose, Bld: 112 mg/dL — ABNORMAL HIGH (ref 65–99)
POTASSIUM: 3.3 mmol/L — AB (ref 3.5–5.1)
SODIUM: 140 mmol/L (ref 135–145)
Total Bilirubin: 0.7 mg/dL (ref 0.3–1.2)
Total Protein: 6.2 g/dL — ABNORMAL LOW (ref 6.5–8.1)

## 2016-03-07 LAB — CBC
HEMATOCRIT: 35.2 % — AB (ref 36.0–46.0)
HEMOGLOBIN: 11.7 g/dL — AB (ref 12.0–15.0)
MCH: 28.6 pg (ref 26.0–34.0)
MCHC: 33.2 g/dL (ref 30.0–36.0)
MCV: 86.1 fL (ref 78.0–100.0)
Platelets: 140 10*3/uL — ABNORMAL LOW (ref 150–400)
RBC: 4.09 MIL/uL (ref 3.87–5.11)
RDW: 13.4 % (ref 11.5–15.5)
WBC: 6.2 10*3/uL (ref 4.0–10.5)

## 2016-03-07 LAB — LIPASE, BLOOD: Lipase: 173 U/L — ABNORMAL HIGH (ref 11–51)

## 2016-03-07 LAB — SURGICAL PCR SCREEN
MRSA, PCR: NEGATIVE
STAPHYLOCOCCUS AUREUS: NEGATIVE

## 2016-03-07 SURGERY — LAPAROSCOPIC CHOLECYSTECTOMY WITH INTRAOPERATIVE CHOLANGIOGRAM
Anesthesia: General

## 2016-03-07 MED ORDER — SUGAMMADEX SODIUM 200 MG/2ML IV SOLN
INTRAVENOUS | Status: DC | PRN
  Administered 2016-03-07: 150 mg via INTRAVENOUS

## 2016-03-07 MED ORDER — PROMETHAZINE HCL 25 MG/ML IJ SOLN
6.2500 mg | INTRAMUSCULAR | Status: DC | PRN
Start: 2016-03-07 — End: 2016-03-07

## 2016-03-07 MED ORDER — PROPOFOL 10 MG/ML IV BOLUS
INTRAVENOUS | Status: DC | PRN
  Administered 2016-03-07: 120 mg via INTRAVENOUS

## 2016-03-07 MED ORDER — IOPAMIDOL (ISOVUE-300) INJECTION 61%
INTRAVENOUS | Status: DC | PRN
  Administered 2016-03-07: 16 mL via INTRAVENOUS

## 2016-03-07 MED ORDER — MIDAZOLAM HCL 5 MG/5ML IJ SOLN
INTRAMUSCULAR | Status: DC | PRN
  Administered 2016-03-07: 2 mg via INTRAVENOUS

## 2016-03-07 MED ORDER — METOPROLOL TARTRATE 12.5 MG HALF TABLET: 12.5000 mg | ORAL_TABLET | Freq: Two times a day (BID) | ORAL | Status: DC | PRN

## 2016-03-07 MED ORDER — SACCHAROMYCES BOULARDII 250 MG PO CAPS
250.0000 mg | ORAL_CAPSULE | Freq: Two times a day (BID) | ORAL | Status: DC
  Administered 2016-03-07 – 2016-03-08 (×3): 250 mg via ORAL
  Filled 2016-03-07 (×3): qty 1

## 2016-03-07 MED ORDER — PROPOFOL 10 MG/ML IV BOLUS
INTRAVENOUS | Status: AC
  Filled 2016-03-07: qty 20

## 2016-03-07 MED ORDER — SODIUM CHLORIDE 0.9% FLUSH: 3.0000 mL | INTRAVENOUS | Status: DC | PRN

## 2016-03-07 MED ORDER — METHOCARBAMOL 1000 MG/10ML IJ SOLN
1000.0000 mg | Freq: Four times a day (QID) | INTRAVENOUS | Status: DC | PRN
  Filled 2016-03-07: qty 10

## 2016-03-07 MED ORDER — DEXTROSE 5 % IV SOLN
2.0000 g | INTRAVENOUS | Status: AC
  Administered 2016-03-07: 2 g via INTRAVENOUS
  Filled 2016-03-07: qty 2

## 2016-03-07 MED ORDER — KETOROLAC TROMETHAMINE 30 MG/ML IJ SOLN
INTRAMUSCULAR | Status: AC
  Filled 2016-03-07: qty 1

## 2016-03-07 MED ORDER — POLYETHYLENE GLYCOL 3350 17 G PO PACK: 17.0000 g | PACK | Freq: Two times a day (BID) | ORAL | Status: DC | PRN

## 2016-03-07 MED ORDER — LACTATED RINGERS IV BOLUS (SEPSIS): 1000.0000 mL | Freq: Three times a day (TID) | INTRAVENOUS | Status: DC | PRN

## 2016-03-07 MED ORDER — BUPIVACAINE-EPINEPHRINE (PF) 0.25% -1:200000 IJ SOLN
INTRAMUSCULAR | Status: AC
  Filled 2016-03-07: qty 30

## 2016-03-07 MED ORDER — IBUPROFEN 200 MG PO TABS
400.0000 mg | ORAL_TABLET | Freq: Four times a day (QID) | ORAL | Status: DC | PRN
  Administered 2016-03-07: 800 mg via ORAL
  Filled 2016-03-07: qty 4

## 2016-03-07 MED ORDER — ALUM & MAG HYDROXIDE-SIMETH 200-200-20 MG/5ML PO SUSP: 30.0000 mL | Freq: Four times a day (QID) | ORAL | Status: DC | PRN

## 2016-03-07 MED ORDER — BUPIVACAINE-EPINEPHRINE 0.25% -1:200000 IJ SOLN
INTRAMUSCULAR | Status: DC | PRN
  Administered 2016-03-07: 60 mL

## 2016-03-07 MED ORDER — MENTHOL 3 MG MT LOZG
1.0000 | LOZENGE | OROMUCOSAL | Status: DC | PRN
Start: 2016-03-07 — End: 2016-03-08
  Filled 2016-03-07: qty 9

## 2016-03-07 MED ORDER — BUPIVACAINE-EPINEPHRINE (PF) 0.25% -1:200000 IJ SOLN
INTRAMUSCULAR | Status: AC
  Filled 2016-03-07: qty 60

## 2016-03-07 MED ORDER — FENTANYL CITRATE (PF) 250 MCG/5ML IJ SOLN
INTRAMUSCULAR | Status: AC
  Filled 2016-03-07: qty 5

## 2016-03-07 MED ORDER — ACETAMINOPHEN 500 MG PO TABS
1000.0000 mg | ORAL_TABLET | Freq: Three times a day (TID) | ORAL | Status: DC
  Administered 2016-03-07 – 2016-03-08 (×4): 1000 mg via ORAL
  Filled 2016-03-07 (×4): qty 2

## 2016-03-07 MED ORDER — FENTANYL CITRATE (PF) 100 MCG/2ML IJ SOLN
INTRAMUSCULAR | Status: DC | PRN
  Administered 2016-03-07: 50 ug via INTRAVENOUS
  Administered 2016-03-07: 100 ug via INTRAVENOUS

## 2016-03-07 MED ORDER — EPHEDRINE SULFATE 50 MG/ML IJ SOLN
INTRAMUSCULAR | Status: DC | PRN
  Administered 2016-03-07: 10 mg via INTRAVENOUS

## 2016-03-07 MED ORDER — METOPROLOL TARTRATE 5 MG/5ML IV SOLN: 5.0000 mg | Freq: Four times a day (QID) | INTRAVENOUS | Status: DC | PRN

## 2016-03-07 MED ORDER — LACTATED RINGERS IR SOLN
Status: DC | PRN
  Administered 2016-03-07: 1

## 2016-03-07 MED ORDER — LIP MEDEX EX OINT
1.0000 "application " | TOPICAL_OINTMENT | Freq: Two times a day (BID) | CUTANEOUS | Status: DC
  Administered 2016-03-07 – 2016-03-08 (×3): 1 via TOPICAL
  Filled 2016-03-07: qty 7

## 2016-03-07 MED ORDER — DEXTROSE IN LACTATED RINGERS 5 % IV SOLN
INTRAVENOUS | Status: DC
  Administered 2016-03-07: 15:00:00 via INTRAVENOUS

## 2016-03-07 MED ORDER — MAGIC MOUTHWASH
15.0000 mL | Freq: Four times a day (QID) | ORAL | Status: DC | PRN
  Filled 2016-03-07: qty 15

## 2016-03-07 MED ORDER — DEXTROSE 5 % IV SOLN
INTRAVENOUS | Status: AC
  Filled 2016-03-07: qty 2

## 2016-03-07 MED ORDER — DIPHENHYDRAMINE HCL 50 MG/ML IJ SOLN
12.5000 mg | Freq: Four times a day (QID) | INTRAMUSCULAR | Status: DC | PRN
  Filled 2016-03-07: qty 1

## 2016-03-07 MED ORDER — PHENOL 1.4 % MT LIQD: 2.0000 | OROMUCOSAL | Status: DC | PRN

## 2016-03-07 MED ORDER — BISMUTH SUBSALICYLATE 262 MG/15ML PO SUSP: 30.0000 mL | Freq: Three times a day (TID) | ORAL | Status: DC | PRN

## 2016-03-07 MED ORDER — ONDANSETRON HCL 4 MG/2ML IJ SOLN
INTRAMUSCULAR | Status: AC
  Filled 2016-03-07: qty 2

## 2016-03-07 MED ORDER — IOPAMIDOL (ISOVUE-300) INJECTION 61%
INTRAVENOUS | Status: AC
  Filled 2016-03-07: qty 50

## 2016-03-07 MED ORDER — KETOROLAC TROMETHAMINE 30 MG/ML IJ SOLN
INTRAMUSCULAR | Status: DC | PRN
  Administered 2016-03-07: 30 mg via INTRAVENOUS

## 2016-03-07 MED ORDER — SODIUM CHLORIDE 0.9 % IV SOLN: 250.0000 mL | INTRAVENOUS | Status: DC | PRN

## 2016-03-07 MED ORDER — FAMOTIDINE 20 MG PO TABS
40.0000 mg | ORAL_TABLET | Freq: Every day | ORAL | Status: DC
  Administered 2016-03-07: 40 mg via ORAL
  Filled 2016-03-07: qty 2

## 2016-03-07 MED ORDER — PROCHLORPERAZINE EDISYLATE 5 MG/ML IJ SOLN
5.0000 mg | INTRAMUSCULAR | Status: DC | PRN
  Administered 2016-03-07: 10 mg via INTRAVENOUS
  Filled 2016-03-07: qty 2

## 2016-03-07 MED ORDER — LACTATED RINGERS IV SOLN
INTRAVENOUS | Status: DC
  Administered 2016-03-07: 1000 mL via INTRAVENOUS
  Administered 2016-03-07: 11:00:00 via INTRAVENOUS

## 2016-03-07 MED ORDER — FENTANYL CITRATE (PF) 100 MCG/2ML IJ SOLN: 25.0000 ug | INTRAMUSCULAR | Status: DC | PRN

## 2016-03-07 MED ORDER — TRAMADOL HCL 50 MG PO TABS
50.0000 mg | ORAL_TABLET | Freq: Four times a day (QID) | ORAL | Status: DC | PRN
  Administered 2016-03-07: 50 mg via ORAL
  Administered 2016-03-07 – 2016-03-08 (×2): 100 mg via ORAL
  Filled 2016-03-07: qty 2
  Filled 2016-03-07: qty 1
  Filled 2016-03-07: qty 2

## 2016-03-07 MED ORDER — SUGAMMADEX SODIUM 200 MG/2ML IV SOLN
INTRAVENOUS | Status: AC
  Filled 2016-03-07: qty 2

## 2016-03-07 MED ORDER — SODIUM CHLORIDE 0.9% FLUSH
3.0000 mL | Freq: Two times a day (BID) | INTRAVENOUS | Status: DC
  Administered 2016-03-07 (×2): 3 mL via INTRAVENOUS

## 2016-03-07 MED ORDER — MIDAZOLAM HCL 2 MG/2ML IJ SOLN
INTRAMUSCULAR | Status: AC
  Filled 2016-03-07: qty 2

## 2016-03-07 MED ORDER — ROCURONIUM BROMIDE 100 MG/10ML IV SOLN
INTRAVENOUS | Status: DC | PRN
  Administered 2016-03-07: 40 mg via INTRAVENOUS

## 2016-03-07 SURGICAL SUPPLY — 37 items
APPLIER CLIP 5 13 M/L LIGAMAX5 (MISCELLANEOUS) ×3
APPLIER CLIP ROT 10 11.4 M/L (STAPLE)
CABLE HIGH FREQUENCY MONO STRZ (ELECTRODE) ×3 IMPLANT
CLIP APPLIE 5 13 M/L LIGAMAX5 (MISCELLANEOUS) ×1 IMPLANT
CLIP APPLIE ROT 10 11.4 M/L (STAPLE) IMPLANT
COVER MAYO STAND STRL (DRAPES) ×3 IMPLANT
COVER SURGICAL LIGHT HANDLE (MISCELLANEOUS) ×3 IMPLANT
DECANTER SPIKE VIAL GLASS SM (MISCELLANEOUS) ×3 IMPLANT
DRAPE C-ARM 42X120 X-RAY (DRAPES) ×3 IMPLANT
DRAPE LAPAROSCOPIC ABDOMINAL (DRAPES) ×3 IMPLANT
DRAPE UTILITY XL STRL (DRAPES) ×3 IMPLANT
DRAPE WARM FLUID 44X44 (DRAPE) ×3 IMPLANT
DRSG TEGADERM 2-3/8X2-3/4 SM (GAUZE/BANDAGES/DRESSINGS) ×9 IMPLANT
DRSG TEGADERM 4X4.75 (GAUZE/BANDAGES/DRESSINGS) ×3 IMPLANT
ELECT REM PT RETURN 9FT ADLT (ELECTROSURGICAL) ×3
ELECTRODE REM PT RTRN 9FT ADLT (ELECTROSURGICAL) ×1 IMPLANT
ENDOLOOP SUT PDS II  0 18 (SUTURE) ×2
ENDOLOOP SUT PDS II 0 18 (SUTURE) ×1 IMPLANT
GLOVE ECLIPSE 8.0 STRL XLNG CF (GLOVE) ×3 IMPLANT
GLOVE INDICATOR 8.0 STRL GRN (GLOVE) ×3 IMPLANT
GOWN STRL REUS W/TWL XL LVL3 (GOWN DISPOSABLE) ×6 IMPLANT
IRRIG SUCT STRYKERFLOW 2 WTIP (MISCELLANEOUS) ×3
IRRIGATION SUCT STRKRFLW 2 WTP (MISCELLANEOUS) ×1 IMPLANT
KIT BASIN OR (CUSTOM PROCEDURE TRAY) ×3 IMPLANT
POSITIONER SURGICAL ARM (MISCELLANEOUS) IMPLANT
POUCH SPECIMEN RETRIEVAL 10MM (ENDOMECHANICALS) IMPLANT
SCISSORS LAP 5X35 DISP (ENDOMECHANICALS) ×3 IMPLANT
SET CHOLANGIOGRAPH MIX (MISCELLANEOUS) ×3 IMPLANT
SLEEVE XCEL OPT CAN 5 100 (ENDOMECHANICALS) IMPLANT
SUT MNCRL AB 4-0 PS2 18 (SUTURE) ×3 IMPLANT
SYR 20CC LL (SYRINGE) ×3 IMPLANT
TOWEL OR 17X26 10 PK STRL BLUE (TOWEL DISPOSABLE) ×3 IMPLANT
TOWEL OR NON WOVEN STRL DISP B (DISPOSABLE) ×3 IMPLANT
TRAY LAPAROSCOPIC (CUSTOM PROCEDURE TRAY) ×3 IMPLANT
TROCAR BLADELESS OPT 5 100 (ENDOMECHANICALS) ×3 IMPLANT
TROCAR XCEL NON-BLD 11X100MML (ENDOMECHANICALS) ×3 IMPLANT
TUBING INSUF HEATED (TUBING) ×3 IMPLANT

## 2016-03-07 NOTE — Anesthesia Preprocedure Evaluation (Addendum)
Anesthesia Evaluation  Patient identified by MRN, date of birth, ID band Patient awake    Reviewed: Allergy & Precautions, NPO status , Patient's Chart, lab work & pertinent test results  Airway Mallampati: II  TM Distance: >3 FB Neck ROM: Full    Dental no notable dental hx.    Pulmonary neg pulmonary ROS,    Pulmonary exam normal breath sounds clear to auscultation       Cardiovascular negative cardio ROS Normal cardiovascular exam Rhythm:Regular Rate:Normal     Neuro/Psych  Headaches, negative psych ROS   GI/Hepatic negative GI ROS, Neg liver ROS,   Endo/Other  negative endocrine ROS  Renal/GU negative Renal ROS  negative genitourinary   Musculoskeletal negative musculoskeletal ROS (+)   Abdominal (+) + obese,   Peds negative pediatric ROS (+)  Hematology negative hematology ROS (+)   Anesthesia Other Findings   Reproductive/Obstetrics negative OB ROS                             Anesthesia Physical Anesthesia Plan  ASA: II  Anesthesia Plan: General   Post-op Pain Management:    Induction: Intravenous  Airway Management Planned: Oral ETT  Additional Equipment:   Intra-op Plan:   Post-operative Plan: Extubation in OR  Informed Consent: I have reviewed the patients History and Physical, chart, labs and discussed the procedure including the risks, benefits and alternatives for the proposed anesthesia with the patient or authorized representative who has indicated his/her understanding and acceptance.   Dental advisory given  Plan Discussed with: CRNA  Anesthesia Plan Comments: (Discussed general anesthesia through professional interpreter (Pragati). Questions answered.)       Anesthesia Quick Evaluation

## 2016-03-07 NOTE — Anesthesia Procedure Notes (Signed)
Procedure Name: Intubation Performed by: Aron Inge J Pre-anesthesia Checklist: Patient identified, Emergency Drugs available, Suction available, Patient being monitored and Timeout performed Patient Re-evaluated:Patient Re-evaluated prior to inductionOxygen Delivery Method: Circle system utilized Preoxygenation: Pre-oxygenation with 100% oxygen Intubation Type: IV induction Ventilation: Mask ventilation without difficulty Laryngoscope Size: Mac and 4 Grade View: Grade I Tube type: Oral Tube size: 7.0 mm Number of attempts: 1 Airway Equipment and Method: Stylet Placement Confirmation: ETT inserted through vocal cords under direct vision,  positive ETCO2,  CO2 detector and breath sounds checked- equal and bilateral Secured at: 21 cm Tube secured with: Tape Dental Injury: Teeth and Oropharynx as per pre-operative assessment        

## 2016-03-07 NOTE — Progress Notes (Signed)
[address] [place], [place] [address] Phone: [phone] FAX: [phone]   Dorris Fetch [id number] Aug 31, 1980  CARE TEAM:  PCP: Minerva Ends, MD  Outpatient Care Team: Patient Care Team: Boykin Nearing, MD as PCP - General (Family Medicine)  Inpatient Treatment Team: Treatment Team: Attending Provider: Albertine Patricia, MD; Registered Nurse: Martyn Malay, RN; Technician: Wilder Glade, NT; Consulting Physician: Nolon Nations, MD; Technician: Shawn Stall, NT; Rounding Team: Threasa Beards, MD; Technician: Virgia Land, NT  Problem List:   Active Problems:   Acute pancreatitis   Pancreatitis due to biliary obstruction      03/08/2016  Procedure(s): LAPAROSCOPIC CHOLECYSTECTOMY WITH INTRAOPERATIVE CHOLANGIOGRAM   Assessment  Gallstone pancreatitis  Plan:  Gallstone pancreatitis - MRCP showed without choledocholithiasis or obstructive mass - U/s showed Cholelithiasis without other evidence of acute cholecystitis - elevated lipase 2068, now 178.  Pain less -Plan lap chole w IOC today:  The anatomy & physiology of hepatobiliary & pancreatic function was discussed.  The pathophysiology of gallbladder dysfunction was discussed.  Natural history risks without surgery was discussed.   I feel the risks of no intervention will lead to serious problems that outweigh the operative risks; therefore, I recommended cholecystectomy to remove the pathology.  I explained laparoscopic techniques with possible need for an open approach.  Probable cholangiogram to evaluate the bilary tract was explained as well.    Risks such as bleeding, infection, abscess, leak, injury to other organs, need for repair of tissues / organs, need for further treatment, stroke, heart attack, death, and other risks were discussed.  I noted a good likelihood this will help address the problem.  Possibility that this will not correct all abdominal  symptoms was explained.  Goals of post-operative recovery were discussed as well.  We will work to minimize complications.  An educational handout further explaining the pathology and treatment options was given as well.  Questions were answered.  The patient & her husband express understanding & wish to proceed with surgery.   -  FEN - NPO except ice chips, IVF  VTE - lovenox, SCD's  ID - none  -mobilize as tolerated to help recovery  Adin Hector, M.D., F.A.C.S. Gastrointestinal and Minimally Invasive Surgery [facility] [address], [place], [place] [address] [phone] Main / Paging   03/07/2016  Subjective:  Less sore Husband at bedside  Objective:  Vital signs:  Vitals:   03/06/16 1454 03/06/16 1700 03/06/16 2131 03/07/16 0632  BP: 106/67  97/64 (!) 95/48  Pulse: 60  100 62  Resp: '18  18 18  '$ Temp: 98.3 F (36.8 C)  98.1 F (36.7 C) 98.2 F (36.8 C)  TempSrc: Oral  Oral Oral  SpO2: 100%  100% 100%  Weight:  54.5 kg (120 lb 2.4 oz)         Intake/Output   Yesterday:  08/30 0701 - 08/31 0700 In: 2135 [I.V.:2135] Out: -  This shift:  No intake/output data recorded.  Bowel function:  Flatus: YES  BM:  No  Drain: (No drain)   Physical Exam:  General: Pt awake/alert/oriented x4 in mild acute distress Eyes: PERRL, normal EOM.  Sclera clear.  No icterus Neuro: CN II-XII intact w/o focal sensory/motor deficits. Lymph: No head/neck/groin lymphadenopathy Psych:  No delerium/psychosis/paranoia HENT: Normocephalic, Mucus membranes moist.  No thrush Neck: Supple, No tracheal deviation Chest: No chest wall pain w good excursion CV:  Pulses intact.  Regular rhythm MS: Normal AROM mjr joints.  No obvious deformity Abdomen: Soft.  Nondistended.  Tenderness at epigastric region - less.  No evidence of peritonitis.  No incarcerated hernias. Ext:  SCDs BLE.  No mjr edema.  No cyanosis Skin: No petechiae /  purpura  Results:   Labs: Results for orders placed or performed during the hospital encounter of 03/06/16 (from the past 48 hour(s))  CBC     Status: Abnormal   Collection Time: 03/06/16  4:43 AM  Result Value Ref Range   WBC 11.0 (H) 4.0 - 10.5 K/uL   RBC 4.33 3.87 - 5.11 MIL/uL   Hemoglobin 12.6 12.0 - 15.0 g/dL   HCT 36.4 36.0 - 46.0 %   MCV 84.1 78.0 - 100.0 fL   MCH 29.1 26.0 - 34.0 pg   MCHC 34.6 30.0 - 36.0 g/dL   RDW 13.0 11.5 - 15.5 %   Platelets 144 (L) 150 - 400 K/uL  Comprehensive metabolic panel     Status: Abnormal   Collection Time: 03/06/16  4:43 AM  Result Value Ref Range   Sodium 141 135 - 145 mmol/L   Potassium 3.4 (L) 3.5 - 5.1 mmol/L   Chloride 111 101 - 111 mmol/L   CO2 25 22 - 32 mmol/L   Glucose, Bld 122 (H) 65 - 99 mg/dL   BUN 12 6 - 20 mg/dL   Creatinine, Ser 0.67 0.44 - 1.00 mg/dL   Calcium 8.8 (L) 8.9 - 10.3 mg/dL   Total Protein 7.0 6.5 - 8.1 g/dL   Albumin 3.7 3.5 - 5.0 g/dL   AST 43 (H) 15 - 41 U/L   ALT 27 14 - 54 U/L   Alkaline Phosphatase 52 38 - 126 U/L   Total Bilirubin 0.8 0.3 - 1.2 mg/dL   GFR calc non Af Amer >60 >60 mL/min   GFR calc Af Amer >60 >60 mL/min    Comment: (NOTE) The eGFR has been calculated using the CKD EPI equation. This calculation has not been validated in all clinical situations. eGFR's persistently <60 mL/min signify possible Chronic Kidney Disease.    Anion gap 5 5 - 15  Lipase, blood     Status: Abnormal   Collection Time: 03/06/16  4:43 AM  Result Value Ref Range   Lipase 2,068 (H) 11 - 51 U/L    Comment: RESULTS CONFIRMED BY MANUAL DILUTION  I-Stat Beta hCG blood, ED ([facility], [facility], [facility] only)     Status: None   Collection Time: 03/06/16  4:57 AM  Result Value Ref Range   I-stat hCG, quantitative <5.0 <5 mIU/mL   Comment 3            Comment:   GEST. AGE      CONC.  (mIU/mL)   <=1 WEEK        5 - 50     2 WEEKS       50 - 500     3 WEEKS       100 - 10,000     4 WEEKS     1,000 - 30,000        FEMALE AND  NON-PREGNANT FEMALE:     LESS THAN 5 mIU/mL   Comprehensive metabolic panel     Status: Abnormal   Collection Time: 03/07/16  5:49 AM  Result Value Ref Range   Sodium 140 135 - 145 mmol/L   Potassium 3.3 (L) 3.5 - 5.1 mmol/L   Chloride  111 101 - 111 mmol/L   CO2 25 22 - 32 mmol/L   Glucose, Bld 112 (H) 65 - 99 mg/dL   BUN 6 6 - 20 mg/dL   Creatinine, Ser 0.52 0.44 - 1.00 mg/dL   Calcium 8.4 (L) 8.9 - 10.3 mg/dL   Total Protein 6.2 (L) 6.5 - 8.1 g/dL   Albumin 3.2 (L) 3.5 - 5.0 g/dL   AST 36 15 - 41 U/L   ALT 44 14 - 54 U/L   Alkaline Phosphatase 50 38 - 126 U/L   Total Bilirubin 0.7 0.3 - 1.2 mg/dL   GFR calc non Af Amer >60 >60 mL/min   GFR calc Af Amer >60 >60 mL/min    Comment: (NOTE) The eGFR has been calculated using the CKD EPI equation. This calculation has not been validated in all clinical situations. eGFR's persistently <60 mL/min signify possible Chronic Kidney Disease.    Anion gap 4 (L) 5 - 15  CBC     Status: Abnormal   Collection Time: 03/07/16  5:49 AM  Result Value Ref Range   WBC 6.2 4.0 - 10.5 K/uL   RBC 4.09 3.87 - 5.11 MIL/uL   Hemoglobin 11.7 (L) 12.0 - 15.0 g/dL   HCT 35.2 (L) 36.0 - 46.0 %   MCV 86.1 78.0 - 100.0 fL   MCH 28.6 26.0 - 34.0 pg   MCHC 33.2 30.0 - 36.0 g/dL   RDW 13.4 11.5 - 15.5 %   Platelets 140 (L) 150 - 400 K/uL  Lipase, blood     Status: Abnormal   Collection Time: 03/07/16  5:49 AM  Result Value Ref Range   Lipase 173 (H) 11 - 51 U/L  Surgical pcr screen     Status: None   Collection Time: 03/07/16  5:58 AM  Result Value Ref Range   MRSA, PCR NEGATIVE NEGATIVE   Staphylococcus aureus NEGATIVE NEGATIVE    Comment:        The Xpert SA Assay (FDA approved for NASAL specimens in patients over 69 years of age), is one component of a comprehensive surveillance program.  Test performance has been validated by [facility] for patients greater than or equal to 50 year old. It is not intended to diagnose infection nor  to guide or monitor treatment.     Imaging / Studies: Mr 3d Recon At Scanner  Result Date: 03/06/2016 CLINICAL DATA:  Pancreatitis secondary to biliary obstruction. Severe right-sided abdominal pain. EXAM: MRI ABDOMEN WITHOUT AND WITH CONTRAST (INCLUDING MRCP) TECHNIQUE: Multiplanar multisequence MR imaging of the abdomen was performed both before and after the administration of intravenous contrast. Heavily T2-weighted images of the biliary and pancreatic ducts were obtained, and three-dimensional MRCP images were rendered by post processing. CONTRAST:  83m MULTIHANCE GADOBENATE DIMEGLUMINE 529 MG/ML IV SOLN COMPARISON:  Ultrasound of earlier today. FINDINGS: Portions of exam are minimally motion degraded. Lower chest: Normal heart size without pericardial or pleural effusion. Hepatobiliary: Normal liver. Isolated 1.3 cm gallstone within the mid gallbladder. No specific evidence of acute cholecystitis. Intrahepatic ducts are upper normal, including on image 14/series 3. The common duct is minimally dilated for patient age. Measures on the order of 7 mm on image 17/ series 3, image 45/ series 4. Upper normal in this age group 6 mm. No choledocholithiasis. No obstructive mass. Tapers gradually more distally and is followed to the level of the ampulla. Pancreas: Enhances normally. No duct dilatation. Subtle edema within the anterior pararenal space, including on image 20/series  3 and image 22/series 3. Spleen: Normal in size, without focal abnormality. Adrenals/Urinary Tract: Normal adrenal glands. Normal kidneys, without hydronephrosis. Stomach/Bowel: Normal stomach and abdominal bowel loops. Vascular/Lymphatic: Normal caliber of the aorta and branch vessels. No retroperitoneal or retrocrural adenopathy. Other: No ascites. Musculoskeletal: No acute osseous abnormality. IMPRESSION: 1. Cholelithiasis. 2. Borderline to minimal biliary duct dilatation, without choledocholithiasis or obstructive mass. Given the  normal bilirubin level of earlier today, most likely within normal variation. 3. Subtle findings which likely relate to the clinical history of pancreatitis. Electronically Signed   By: Jeronimo Greaves M.D.   On: 03/06/2016 12:06   US Abdomen Limited  Result Date: 03/06/2016 CLINICAL DATA:  Sudden onset right upper quadrant pain EXAM: US ABDOMEN LIMITED - RIGHT UPPER QUADRANT COMPARISON:  Abdominal ultrasound 11/15/2015 FINDINGS: Gallbladder: There is a 1.2 cm stone in shows limited mobility, near the gallbladder neck. Small gravel like stones are also seen with the patient in left lateral decubitus position. There is no gallbladder wall thickening or pericholecystic fluid. No positive sonographic Eulah Pont sign was demonstrated by the sonographer. 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 Common bile duct: Diameter: 6.5 mm proximally, 4.6 mm distally Liver: Increased echogenicity may indicate hepatic steatosis. No focal lesion. IMPRESSION: 1. Cholelithiasis without other evidence of acute cholecystitis. 2. 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 Electronically Signed   By: Deatra Robinson M.D.   On: 03/06/2016 05:44   Mr Roe Coombs W/wo Cm/mrcp  Result Date: 03/06/2016 CLINICAL DATA:  Pancreatitis secondary to biliary obstruction. Severe right-sided abdominal pain. EXAM: MRI ABDOMEN WITHOUT AND WITH CONTRAST (INCLUDING MRCP) TECHNIQUE: Multiplanar multisequence MR imaging of the abdomen was performed both before and after the administration of intravenous contrast. Heavily T2-weighted images of the biliary and pancreatic ducts were obtained, and three-dimensional MRCP images were rendered by post processing. CONTRAST:  50mL MULTIHANCE GADOBENATE DIMEGLUMINE 529 MG/ML IV SOLN COMPARISON:  Ultrasound of earlier today. FINDINGS: Portions of exam are minimally motion degraded. Lower chest: Normal heart size  without pericardial or pleural effusion. Hepatobiliary: Normal liver. Isolated 1.3 cm gallstone within the mid gallbladder. No specific evidence of acute cholecystitis. Intrahepatic ducts are upper normal, including on image 14/series 3. The common duct is minimally dilated for patient age. Measures on the order of 7 mm on image 17/ series 3, image 45/ series 4. Upper normal in this age group 6 mm. No choledocholithiasis. No obstructive mass. Tapers gradually more distally and is followed to the level of the ampulla. Pancreas: Enhances normally. No duct dilatation. Subtle edema within the anterior pararenal space, including on image 20/series 3 and image 22/series 3. Spleen: Normal in size, without focal abnormality. Adrenals/Urinary Tract: Normal adrenal glands. Normal kidneys, without hydronephrosis. Stomach/Bowel: Normal stomach and abdominal bowel loops. Vascular/Lymphatic: Normal caliber of the aorta and branch vessels. No retroperitoneal or retrocrural adenopathy. Other: No ascites. Musculoskeletal: No acute osseous abnormality. IMPRESSION: 1. Cholelithiasis. 2. Borderline to minimal biliary duct dilatation, without choledocholithiasis or obstructive mass. Given the normal bilirubin level of earlier today, most likely within normal variation. 3. Subtle findings which likely relate to the clinical history of pancreatitis. Electronically Signed   By: Jeronimo Greaves M.D.   On: 03/06/2016 12:06    Medications / Allergies: per chart  Antibiotics: Anti-infectives    None        Note: Portions of this report may have been transcribed using voice recognition software. Every effort was  made to ensure accuracy; however, inadvertent computerized transcription errors may be present.   Any transcriptional errors that result from this process are unintentional.     Adin Hector, M.D., F.A.C.S. Gastrointestinal and Minimally Invasive Surgery [facility] [address], [place], [place] [address] [phone] Main / Paging   03/07/2016

## 2016-03-07 NOTE — Anesthesia Postprocedure Evaluation (Signed)
Anesthesia Post Note  Patient: Cora CollumGanga Darjee  Procedure(s) Performed: Procedure(s) (LRB): LAPAROSCOPIC CHOLECYSTECTOMY WITH INTRAOPERATIVE CHOLANGIOGRAM (N/A)  Patient location during evaluation: PACU Anesthesia Type: General Level of consciousness: awake and alert Pain management: pain level controlled Vital Signs Assessment: post-procedure vital signs reviewed and stable Respiratory status: spontaneous breathing, nonlabored ventilation, respiratory function stable and patient connected to nasal cannula oxygen Cardiovascular status: blood pressure returned to baseline and stable Postop Assessment: no signs of nausea or vomiting Anesthetic complications: no    Last Vitals:  Vitals:   03/07/16 1149 03/07/16 1157  BP: 123/81 124/77  Pulse:  80  Resp: (!) 27 18  Temp: 36.3 C 36.7 C    Last Pain:  Vitals:   03/07/16 1319  TempSrc:   PainSc: 9                  Kazuto Sevey J

## 2016-03-07 NOTE — Progress Notes (Signed)
PROGRESS NOTE                                                                                                                                                                                                             Patient Demographics:    Tiffany Johns, is a 35 y.o. female, DOB - January 22, 1981, ZOX:[id number]  Admit date - 03/06/2016   Admitting Physician Alberteen Sam, MD  Outpatient Primary MD for the patient is Lora Paula, MD  LOS - 1  Chief Complaint  Patient presents with  . Abdominal Pain       Brief Narrative   35 year old female with recurrent admission for biliary colic, admitted with gallstone pancreatitis, plan for laparoscopic cholecystectomy today.   Subjective:    Tiffany Johns today has, No headache, No chest pain, No abdominal pain - For small nausea, no vomiting, mild abdominal pain  Assessment  & Plan :    Principal Problem:   Pancreatitis due to biliary obstruction Active Problems:   Gallstones   Acute pancreatitis  Gallstone pancreatitis - MRCP showed without choledocholithiasis or obstructive mass - Ultrasound showed Cholelithiasis without other evidence of acute cholecystitis - elevated lipase 2068, pending down, today is 178  - Surgical input greatly appreciated, plan for laparoscopic cholecystectomy with IOC today  Cholelithiasis  - Ultrasound with evidence of 1.2 cm stone in gallbladder , plan for laparoscopic cholecystectomy today .  Hypotension. - Resolved with hydration  Hypokalemia.  - Repleted   Code Status : Full  Family Communication  : Husband at bedside  Disposition Plan  : Home   Consults  :  Gen surgery  Procedures  : None  DVT Prophylaxis  :  Lovenox   Lab Results  Component Value Date   PLT 140 (L) 03/07/2016    Antibiotics  :   Anti-infectives    Start     Dose/Rate Route Frequency Ordered Stop   03/07/16 1015  cefTRIAXone (ROCEPHIN) 2 g in dextrose 5 % 50  mL IVPB    Comments:  Pharmacy may adjust dosing strength, interval, or rate of medication as needed for optimal therapy for the patient Send with patient on call to the OR.  Anesthesia to complete antibiotic administration <37min prior to incision per [facility].   2 g 100 mL/hr over 30 Minutes Intravenous On call  to O.R. 03/07/16 1001 03/07/16 1031        Objective:   Vitals:   03/06/16 2131 03/07/16 0632 03/07/16 0834 03/07/16 1130  BP: 97/64 (!) 95/48 107/76   Pulse: 100 62 67   Resp: 18 18 (!) 22 (P) 15  Temp: 98.1 F (36.7 C) 98.2 F (36.8 C) 98.1 F (36.7 C) (P) 97.6 F (36.4 C)  TempSrc: Oral Oral Oral   SpO2: 100% 100% 100%   Weight:        Wt Readings from Last 3 Encounters:  03/06/16 54.5 kg (120 lb 2.4 oz)  02/27/16 52.5 kg (115 lb 12.8 oz)  02/18/16 54.4 kg (120 lb)     Intake/Output Summary (Last 24 hours) at 03/07/16 1135 Last data filed at 03/07/16 1114  Gross per 24 hour  Intake             3135 ml  Output               25 ml  Net             3110 ml     Physical Exam  Awake Alert, Oriented X 3 Supple Neck,No JVD Symmetrical Chest wall movement, Good air movement bilaterall RRR,No Gallops,Rubs or new Murmurs, No Parasternal Heave +ve B.Sounds, Abd Soft, Mild tenderness in epigastric area No Cyanosis, Clubbing or edema, No new Rash or bruise     Data Review:    CBC  Recent Labs Lab 03/06/16 0443 03/07/16 0549  WBC 11.0* 6.2  HGB 12.6 11.7*  HCT 36.4 35.2*  PLT 144* 140*  MCV 84.1 86.1  MCH 29.1 28.6  MCHC 34.6 33.2  RDW 13.0 13.4    Chemistries   Recent Labs Lab 03/06/16 0443 03/07/16 0549  NA 141 140  K 3.4* 3.3*  CL 111 111  CO2 25 25  GLUCOSE 122* 112*  BUN 12 6  CREATININE 0.67 0.52  CALCIUM 8.8* 8.4*  AST 43* 36  ALT 27 44  ALKPHOS 52 50  BILITOT 0.8 0.7   ------------------------------------------------------------------------------------------------------------------ No results for input(s): CHOL, HDL,  LDLCALC, TRIG, CHOLHDL, LDLDIRECT in the last 72 hours.  No results found for: HGBA1C ------------------------------------------------------------------------------------------------------------------ No results for input(s): TSH, T4TOTAL, T3FREE, THYROIDAB in the last 72 hours.  Invalid input(s): FREET3 ------------------------------------------------------------------------------------------------------------------ No results for input(s): VITAMINB12, FOLATE, FERRITIN, TIBC, IRON, RETICCTPCT in the last 72 hours.  Coagulation profile No results for input(s): INR, PROTIME in the last 168 hours.  No results for input(s): DDIMER in the last 72 hours.  Cardiac Enzymes No results for input(s): CKMB, TROPONINI, MYOGLOBIN in the last 168 hours.  Invalid input(s): CK ------------------------------------------------------------------------------------------------------------------ No results found for: BNP  Inpatient Medications  Scheduled Meds: . [MAR Hold] enoxaparin (LOVENOX) injection  40 mg Subcutaneous Q24H  . [MAR Hold] pantoprazole  40 mg Intravenous Q12H   Continuous Infusions: . dextrose 5% lactated ringers Stopped (03/07/16 0942)  . lactated ringers 1,000 mL (03/07/16 0941)   PRN Meds:.[MAR Hold] acetaminophen **OR** [MAR Hold] acetaminophen, fentaNYL (SUBLIMAZE) injection, [MAR Hold]  morphine injection, [MAR Hold] ondansetron **OR** [MAR Hold] ondansetron (ZOFRAN) IV, promethazine  Micro Results Recent Results (from the past 240 hour(s))  Surgical pcr screen     Status: None   Collection Time: 03/07/16  5:58 AM  Result Value Ref Range Status   MRSA, PCR NEGATIVE NEGATIVE Final   Staphylococcus aureus NEGATIVE NEGATIVE Final    Comment:        The Xpert SA Assay (FDA approved for  NASAL specimens in patients over 35 years of age), is one component of a comprehensive surveillance program.  Test performance has been validated by [facility] for patients  greater than or equal to 35 year old. It is not intended to diagnose infection nor to guide or monitor treatment.     Radiology Reports Mr 3d Recon At Scanner  Result Date: 03/06/2016 CLINICAL DATA:  Pancreatitis secondary to biliary obstruction. Severe right-sided abdominal pain. EXAM: MRI ABDOMEN WITHOUT AND WITH CONTRAST (INCLUDING MRCP) TECHNIQUE: Multiplanar multisequence MR imaging of the abdomen was performed both before and after the administration of intravenous contrast. Heavily T2-weighted images of the biliary and pancreatic ducts were obtained, and three-dimensional MRCP images were rendered by post processing. CONTRAST:  10mL MULTIHANCE GADOBENATE DIMEGLUMINE 529 MG/ML IV SOLN COMPARISON:  Ultrasound of earlier today. FINDINGS: Portions of exam are minimally motion degraded. Lower chest: Normal heart size without pericardial or pleural effusion. Hepatobiliary: Normal liver. Isolated 1.3 cm gallstone within the mid gallbladder. No specific evidence of acute cholecystitis. Intrahepatic ducts are upper normal, including on image 14/series 3. The common duct is minimally dilated for patient age. Measures on the order of 7 mm on image 17/ series 3, image 45/ series 4. Upper normal in this age group 6 mm. No choledocholithiasis. No obstructive mass. Tapers gradually more distally and is followed to the level of the ampulla. Pancreas: Enhances normally. No duct dilatation. Subtle edema within the anterior pararenal space, including on image 20/series 3 and image 22/series 3. Spleen: Normal in size, without focal abnormality. Adrenals/Urinary Tract: Normal adrenal glands. Normal kidneys, without hydronephrosis. Stomach/Bowel: Normal stomach and abdominal bowel loops. Vascular/Lymphatic: Normal caliber of the aorta and branch vessels. No retroperitoneal or retrocrural adenopathy. Other: No ascites. Musculoskeletal: No acute osseous abnormality. IMPRESSION: 1. Cholelithiasis. 2. Borderline to minimal  biliary duct dilatation, without choledocholithiasis or obstructive mass. Given the normal bilirubin level of earlier today, most likely within normal variation. 3. Subtle findings which likely relate to the clinical history of pancreatitis. Electronically Signed   By: Jeronimo GreavesKyle  Talbot M.D.   On: 03/06/2016 12:06   [place] Abdomen Limited  Result Date: 03/06/2016 CLINICAL DATA:  Sudden onset right upper quadrant pain EXAM: US ABDOMEN LIMITED - RIGHT UPPER QUADRANT COMPARISON:  Abdominal ultrasound 11/15/2015 FINDINGS: Gallbladder: There is a 1.2 cm stone in shows limited mobility, near the gallbladder neck. Small gravel like stones are also seen with the patient in left lateral decubitus position. There is no gallbladder wall thickening or pericholecystic fluid. No positive sonographic Eulah PontMurphy sign was demonstrated by the sonographer. 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 Common bile duct: Diameter: 6.5 mm proximally, 4.6 mm distally Liver: Increased echogenicity may indicate hepatic steatosis. No focal lesion. IMPRESSION: 1. Cholelithiasis without other evidence of acute cholecystitis. 2. 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 Electronically Signed   By: Deatra RobinsonKevin  Herman M.D.   On: 03/06/2016 05:44   Mr Roe Coombsbd W/wo Cm/mrcp  Result Date: 03/06/2016 CLINICAL DATA:  Pancreatitis secondary to biliary obstruction. Severe right-sided abdominal pain. EXAM: MRI ABDOMEN WITHOUT AND WITH CONTRAST (INCLUDING MRCP) TECHNIQUE: Multiplanar multisequence MR imaging of the abdomen was performed both before and after the administration of intravenous contrast. Heavily T2-weighted images of the biliary and pancreatic ducts were obtained, and three-dimensional MRCP images were rendered by post processing. CONTRAST:  10mL MULTIHANCE GADOBENATE DIMEGLUMINE 529 MG/ML IV SOLN COMPARISON:  Ultrasound of earlier today.  FINDINGS:  Portions of exam are minimally motion degraded. Lower chest: Normal heart size without pericardial or pleural effusion. Hepatobiliary: Normal liver. Isolated 1.3 cm gallstone within the mid gallbladder. No specific evidence of acute cholecystitis. Intrahepatic ducts are upper normal, including on image 14/series 3. The common duct is minimally dilated for patient age. Measures on the order of 7 mm on image 17/ series 3, image 45/ series 4. Upper normal in this age group 6 mm. No choledocholithiasis. No obstructive mass. Tapers gradually more distally and is followed to the level of the ampulla. Pancreas: Enhances normally. No duct dilatation. Subtle edema within the anterior pararenal space, including on image 20/series 3 and image 22/series 3. Spleen: Normal in size, without focal abnormality. Adrenals/Urinary Tract: Normal adrenal glands. Normal kidneys, without hydronephrosis. Stomach/Bowel: Normal stomach and abdominal bowel loops. Vascular/Lymphatic: Normal caliber of the aorta and branch vessels. No retroperitoneal or retrocrural adenopathy. Other: No ascites. Musculoskeletal: No acute osseous abnormality. IMPRESSION: 1. Cholelithiasis. 2. Borderline to minimal biliary duct dilatation, without choledocholithiasis or obstructive mass. Given the normal bilirubin level of earlier today, most likely within normal variation. 3. Subtle findings which likely relate to the clinical history of pancreatitis. Electronically Signed   By: Jeronimo Greaves M.D.   On: 03/06/2016 12:06    ELGERGAWY, DAWOOD M.D on 03/07/2016 at 11:35 AM  Between 7am to 7pm - Pager - [phone]  After 7pm go to [email or web address] - password [facility]  [facility] -  Office  [phone]

## 2016-03-07 NOTE — Discharge Instructions (Signed)
LAPAROSCOPIC SURGERY: POST OP INSTRUCTIONS  ######################################################################  EAT Gradually transition to a high fiber diet with a fiber supplement over the next few weeks after discharge.  Start with a pureed / full liquid diet (see below)  WALK Walk an hour a day.  Control your pain to do that.    CONTROL PAIN Control pain so that you can walk, sleep, tolerate sneezing/coughing, go up/down stairs.  HAVE A BOWEL MOVEMENT DAILY Keep your bowels regular to avoid problems.  OK to try a laxative to override constipation.  OK to use an antidairrheal to slow down diarrhea.  Call if not better after 2 tries  CALL IF YOU HAVE PROBLEMS/CONCERNS Call if you are still struggling despite following these instructions. Call if you have concerns not answered by these instructions  ######################################################################    1. DIET: Follow a light bland diet the first 24 hours after arrival home, such as soup, liquids, crackers, etc.  Be sure to include lots of fluids daily.  Avoid fast food or heavy meals as your are more likely to get nauseated.  Eat a low fat the next few days after surgery.   2. Take your usually prescribed home medications unless otherwise directed. 3. PAIN CONTROL: a. Pain is best controlled by a usual combination of three different methods TOGETHER: i. Ice/Heat ii. Over the counter pain medication iii. Prescription pain medication b. Most patients will experience some swelling and bruising around the incisions.  Ice packs or heating pads (30-60 minutes up to 6 times a day) will help. Use ice for the first few days to help decrease swelling and bruising, then switch to heat to help relax tight/sore spots and speed recovery.  Some people prefer to use ice alone, heat alone, alternating between ice & heat.  Experiment to what works for you.  Swelling and bruising can take several weeks to resolve.   c. It is  helpful to take an over-the-counter pain medication regularly for the first few weeks.  Choose one of the following that works best for you: i. Naproxen (Aleve, etc)  Two 251m tabs twice a day ii. Ibuprofen (Advil, etc) Three 2053mtabs four times a day (every meal & bedtime) iii. Acetaminophen (Tylenol, etc) 500-65044mour times a day (every meal & bedtime) d. A  prescription for pain medication (such as oxycodone, hydrocodone, etc) should be given to you upon discharge.  Take your pain medication as prescribed.  i. If you are having problems/concerns with the prescription medicine (does not control pain, nausea, vomiting, rash, itching, etc), please call us [phone] see if we need to switch you to a different pain medicine that will work better for you and/or control your side effect better. ii. If you need a refill on your pain medication, please contact your pharmacy.  They will contact our office to request authorization. Prescriptions will not be filled after 5 pm or on week-ends. 4. Avoid getting constipated.  Between the surgery and the pain medications, it is common to experience some constipation.  Increasing fluid intake and taking a fiber supplement (such as Metamucil, Citrucel, FiberCon, MiraLax, etc) 1-2 times a day regularly will usually help prevent this problem from occurring.  A mild laxative (prune juice, Milk of Magnesia, MiraLax, etc) should be taken according to package directions if there are no bowel movements after 48 hours.   5. Watch out for diarrhea.  If you have many loose bowel movements, simplify your diet to bland foods & liquids for  a few days.  Stop any stool softeners and decrease your fiber supplement.  Switching to mild anti-diarrheal medications (Kayopectate, Pepto Bismol) can help.  If this worsens or does not improve, please call us. 6. Wash / shower every day.  You may shower over the dressings as they are waterproof.  Continue to shower over incision(s)  after the dressing is off. 7. Remove your waterproof bandages 5 days after surgery.  You may leave the incision open to air.  You may replace a dressing/Band-Aid to cover the incision for comfort if you wish.  8. ACTIVITIES as tolerated:   a. You may resume regular (light) daily activities beginning the next day--such as daily self-care, walking, climbing stairs--gradually increasing activities as tolerated.  If you can walk 30 minutes without difficulty, it is safe to try more intense activity such as jogging, treadmill, bicycling, low-impact aerobics, swimming, etc. b. Save the most intensive and strenuous activity for last such as sit-ups, heavy lifting, contact sports, etc  Refrain from any heavy lifting or straining until you are off narcotics for pain control.   c. DO NOT PUSH THROUGH PAIN.  Let pain be your guide: If it hurts to do something, don't do it.  Pain is your body warning you to avoid that activity for another week until the pain goes down. d. You may drive when you are no longer taking prescription pain medication, you can comfortably wear a seatbelt, and you can safely maneuver your car and apply brakes. e. Bonita Quin may have sexual intercourse when it is comfortable.  9. FOLLOW UP in our office a. Please call [facility] at [phone] to set up an appointment to see your surgeon in the office for a follow-up appointment approximately 2-3 weeks after your surgery. b. Make sure that you call for this appointment the day you arrive home to insure a convenient appointment time. 10. IF YOU HAVE DISABILITY OR FAMILY LEAVE FORMS, BRING THEM TO THE OFFICE FOR PROCESSING.  DO NOT GIVE THEM TO YOUR DOCTOR.   WHEN TO CALL us [phone]: 1. Poor pain control 2. Reactions / problems with new medications (rash/itching, nausea, etc)  3. Fever over 101.5 F (38.5 C) 4. Inability to urinate 5. Nausea and/or vomiting 6. Worsening swelling or bruising 7. Continued bleeding from incision. 8. Increased  pain, redness, or drainage from the incision   The clinic staff is available to answer your questions during regular business hours (8:30am-5pm).  Please dont hesitate to call and ask to speak to one of our nurses for clinical concerns.   If you have a medical emergency, go to the nearest emergency room or call 911.  A surgeon from [facility] is always on call at the [facility], [place] [address], [place], [place]  [address] ? MAIN: [phone]) [phone] ? TOLL FREE: [phone] ?  FAX [phone] [email or web address]    Acute Pancreatitis Acute pancreatitis is a disease in which the pancreas becomes suddenly irritated (inflamed). The pancreas is a large gland behind your stomach. The pancreas makes enzymes that help digest food. The pancreas also makes 2 hormones that help control your blood sugar. Acute pancreatitis happens when the enzymes attack and damage the pancreas. Most attacks last a couple of days and can cause serious problems. HOME CARE  Follow your doctor's diet instructions. You may need to avoid alcohol and limit fat in your diet.  Eat small meals often.  Drink enough fluids to  keep your pee (urine) clear or pale yellow.  Only take medicines as told by your doctor.  Avoid drinking alcohol if it caused your disease.  Do not smoke.  Get plenty of rest.  Check your blood sugar at home as told by your doctor.  Keep all doctor visits as told. GET HELP IF:  You do not get better as quickly as expected.  You have new or worsening symptoms.  You have lasting pain, weakness, or feel sick to your stomach (nauseous).  You get better and then have another pain attack. GET HELP RIGHT AWAY IF:   You are unable to eat or keep fluids down.  Your pain becomes severe.  You have a fever or lasting symptoms for more than 2 to 3 days.  You have a fever and your symptoms suddenly get worse.  Your skin or  the white part of your eyes turn yellow (jaundice).  You throw up (vomit).  You feel dizzy, or you pass out (faint).  Your blood sugar is high (over 300 mg/dL). MAKE SURE YOU:   Understand these instructions.  Will watch your condition.  Will get help right away if you are not doing well or get worse.   This information is not intended to replace advice given to you by your health care provider. Make sure you discuss any questions you have with your health care provider.   Document Released: 12/11/2007 Document Revised: 07/15/2014 Document Reviewed: 10/03/2011 Elsevier Interactive Patient Education 2016 [employer]  ??????????? ?????: ????? ?? ???????  ##################################################### ###################  EAT ???? - ???? ?? ???? ????? ???? ?? ???? ?????? ??? ????? ???? ?????? ?? ?? ????? ???? ??? ???????? ?? ????? / ????? ??? ???? ??? ???? ????????? (?? ??????????)  WALK ?? ??????? ?? ????? ??????????? ????? ???? ????? ????? ????????? ??????????  ????????? ?? ????? ????????? ????????? ???? ????? ??????, ?????, ??? ??????? / ?????? ??? ??????????, ???? / ?? ??????? ???? ?????????  ?? ???? ??????? ????? ? ????????? ??????? ???? ??????? ??????? ?????? ??????????? ??????? ??????? ???? ????? ?????? ???? ??? ?? ?????? ???? ???? ??????????? ?????? ???? ??? ?? ?? ?????? ??? ??? 2 ?? ?????????  ??? ???????? ????????? / ??????????? ??? ??? ?? ????????? ??? ???? ??? ??? ?? ??????????? ????????? ?????? ????? ?????????? ?? ????????? ??? ???????? ?? ????????????????? ????? ?????? ????????? ?????  ##################################################### ###################    DIET: ???? ???? ????? 24 ??????? ????? ????? ???? ???? ?????????, ????? ???, ??? ??????, ????????, ???? ????? ????? ???? ??? ????????? ?????? ???? ??????? ????????? ????? ??? ?? ???? ???? ????????? ????? ?????? ???? ?????? ???? ????? ???? ?? ?????? ??? ????? ???? ?? ????? ???? ???????  ?????????? ????????? ??? ???? ???????? ?? ???? ?????? ????????? ??????????? ???? ?????????: ????? ?????? ??????? ??? ??? ??? ???????? ?? ?? ?????? ?????? ????????? ? TOGETHER: ??? / ????? ??????? ????? ?????? ?????????? ????? ???? ?????? ????????? ???? ???? ? ????? ???????? ????? ????? ????????? ??? ????? ?? ??? ????? (????? 6 ???? 6 ??? ?????? 60-60 ?????) ????? ??????? ?????? ? ????? ????? ??????? ???? ????? ???? ????? ???? ???? ?????? ?????????, ??????? ??? / ??? ???? ? ??? ???: ?????????? ????? ???? ??????? ????? ?????????? ???? ?????????? ????? ???? ?????? ???? ????????, ????? ??????, ???? ? ????? ????? ??????? ??????? ???? ?? ??? ????? ?????? ????? ??????? ?????? ? ??? ??????? ???? ?????? ???? ???? ????? ????? ????? ?? ????? ??????? ???? ?????? ????? ???-?-??????? ????? ???? ??? ?????? ?? ????? ????? ???? ???? ????????? ??? ??????? ???? ?????? ??? ?????: ?????????? (????, ???) ??? ??? ??? ???  220 ???? ????? Ibuprofen (Advil, etc) ?? ??? 200mg  ???????? ??? ??? (???? ???? ? ????) Acetaminophen (Tylenol, ???) 500-650mg  ?? ??? ??? ???? (???? ???? ? ??????) ????????????? ???? ???? (????? ??????????, ????????????, ???) ??? ????????? ???????? ??????? ????????? ????? ??????? ??????? ???? ????????? ??? ???????? ????????? ?????? ??? ????????? / ????????? ??? (??: ?, ?????, ?????, ??, ???? ???) ????????? ??????, ????? ??????? ??? ????????? (336) [phone] ?? ???? ?? ???? ???????? ?? ??? ????? ?????? ????? ???? ?????? ? ??? ???? ??????? ???? ?????? ??? ?????? ? / ?? ??????? ???? ????????? ?????? ???????? ??? ???????? ????? ??????? ?????? ????? ??????? ??? ????? ??????? ??????????? ??????? ?????????? ??????? ??? ????????????? ???? ???? (????? ??????????, ????????????, ???) ??? ????????? ???????? ??????? ????????? ????? ??????? ??????? ???? ????????? ??? ???????? ????????? ?????? ??? ????????? / ????????? ??? (??: ?, ?????, ?????, ??, ???? ???) ????????? ??????, ????? ??????? ??? ????????? (336) [phone] ??  ???? ?? ???? ???????? ?? ??? ????? ?????? ????? ???? ?????? ? ??? ???? ??????? ???? ?????? ??? ?????? ? / ?? ??????? ???? ????????? ?????? ???????? ??? ???????? ????? ??????? ?????? ????? ??????? ??? ????? ??????? ??????????? ??????? ?????????? ????????? ?????? ???? ????????? ?????? ??????????? ??????? ????????? ?????????? 5 ??? ?? ?????-???????? ?????? ???? ???????????? ?????????? ? ??????????? ?? ???, ?? ???? ??????? ?? ????? ?? ???? ??????? ?? ??? ???? ???? ? ?? ????? ???? (????? Metamucil, Citrucel, FiberCon, MirLax, ???) ?? ??? ?????? ??? ???? ??? ?? ????? ?????????? ?? ????????? ????? ????? ??????? ??? ????? ???????? 48 ????? ??? ???? ?????? ?????? ?????? ??? ??????? ????????????? ?????? ????? ???????? (??????? ??, ???????? ????????????, ??????????? ???) ??? ??????? ???????? ???? ??????????? ??? ???????? ???? ???? ??? ????????? ??? ???, ??????? ??????? ???? ????? ???? ??????????? ? ??? ????????? ????? ??? ??????????? ???? ????? ????????? ?????????? ? ??????? ????? ???? ??????????? ????? ????-???? ???????? ????????? ????? ????? (???????????, ????? ???????) ????? ???? ?????? ??? ?? ???????? ?? ????? ?????? ??? ????? ??????? ?? ?????????? ???? ??? ????? / ????? ?????????? ????? ???????? ?? ????? ?? ??? ?? ??? ????? ??? ????? ???????? ???? ????? ??????? (???) ?? ????? ???? ??????????? ??????? ????? ????? ?????? ??? 5 ??? ??????????? ????? ?????? ?????? ????? ??????????? ??? ????? ??????????? ?????? ???? ??????? ???? ???? ???????? / ???????-????? ????? ??????????? ?????????? ????? ??????????: ????? ?????? ??? (?????) ????? ????????????? ?????? ???? ???? ?????????? - ????? ????-????????, ???????, ??????? ???????-????-???? ????? ????????????? ?????????? ?????? ??? ????? ?????? ???? 30 ????? ????? ?????????? ???, ?? ???? ??? ?????????? ????? ??????, ????????, ????????????, ??-?????? ????????, ???????? ??? ?????? ???? ???????? ?? ???????? ??? ? ??????? ???????????? ???? ??????? ????? ???? ?????, ???? ??????,  ??????? ??????, ??? ???? ??? ???? ????? ?? ?????????? ?????????? ?? ???? ????? ????????????? ???? ???????? ??????????? ???? ???? ?????????? ???????? ????????? ???????? ??????? ?????????? ????????: ??? ?? ???? ???? ??????????, ??????????? ??????? ??????? ??????? ??????? ????? ?? ????? ????? ??????? ???? ?? ????????????? ???? ???????? ?????? ?????? ?? ????? ?? ?????? ?????? ???? ???? ???? ???? ????? ?????? ???? ??????????, ????? ?????????? ??? ????? ???? ??????????, ? ????? ???????? ????? ????? ??? ???? ? ????? ????? ??????????? ?? ??? ????? ??? ????? ??? ?????? UP ?????? ?????????? ?????????? ????? ???????? (336) [phone] ?? ??? ??????? ???? ?????? ???????? ???? ??????? ?????????? ????? ?????? ???????? ???? ???? 2-3 ????? ??? ?????? ???? ?? ??????? ????????? ?? ???? ?????

## 2016-03-07 NOTE — Op Note (Signed)
03/07/2016  11:32 AM  PATIENT:  Tiffany Johns  35 y.o. female  Patient Care Team: Dessa PhiJosalyn Funches, MD as PCP - General (Family Medicine)  PRE-OPERATIVE DIAGNOSIS:  Gallstone pancreatitis  POST-OPERATIVE DIAGNOSIS:  Gallstone pancreatitis  PROCEDURE:   LAPAROSCOPIC CHOLECYSTECTOMY WITH INTRAOPERATIVE CHOLANGIOGRAM  SURGEON:  Surgeon(s): Karie SodaSteven Tahesha Skeet, MD  ASSISTANT: RN   ANESTHESIA:   local and general  EBL:  Total I/O In: 1000 [I.V.:1000] Out: 25 [Blood:25]  Delay start of Pharmacological VTE agent (>24hrs) due to surgical blood loss or risk of bleeding:  no  DRAINS: none   SPECIMEN:  Source of Specimen:  Gallbladder   DISPOSITION OF SPECIMEN:  PATHOLOGY  COUNTS:  YES  PLAN OF CARE: Admit for overnight observation  PATIENT DISPOSITION:  PACU - hemodynamically stable.  INDICATION:   The anatomy & physiology of hepatobiliary & pancreatic function was discussed.  The pathophysiology of gallbladder dysfunction was discussed.  Natural history risks without surgery was discussed.   I feel the risks of no intervention will lead to serious problems that outweigh the operative risks; therefore, I recommended cholecystectomy to remove the pathology.  I explained laparoscopic techniques with possible need for an open approach.  Probable cholangiogram to evaluate the bilary tract was explained as well.    Risks such as bleeding, infection, abscess, leak, injury to other organs, need for further treatment, heart attack, death, and other risks were discussed.  I noted a good likelihood this will help address the problem.  Possibility that this will not correct all abdominal symptoms was explained.  Goals of post-operative recovery were discussed as well.  We will work to minimize complications.  An educational handout further explaining the pathology and treatment options was given as well.  Questions were answered.  The patient expresses understanding & wishes to proceed with  surgery.  OR FINDINGS: Enlarged boggy gallbladder with some chronic changes consistent with chronic cholecystitis.  No evidence of gangrene or necrosis or ischemia though.  Dilated extrahepatic biliary system.  However no obstruction.  No choledocholithiasis.  Intrahepatic chains seemed normal.  Liver seem normal without any obvious obvious fatty change nor cirrhosis.  DESCRIPTION:   The patient was identified & brought in the operating room. The patient was positioned supine with arms tucked. SCDs were active during the entire case. The patient underwent general anesthesia without any difficulty.  The abdomen was prepped and draped in a sterile fashion. A Surgical Timeout confirmed our plan.  I made a transverse curvilinear incision through the superior umbilical fold.  I placed a 5mm long port through the supraumbilical fascia using a modified Hassan cutdown technique. I began carbon dioxide insufflation. Camera inspection revealed no injury. There were no adhesions to the anterior abdominal wall supraumbilically.  I proceeded to continue with single site technique. I placed a #5 port in left upper aspect of the wound. I placed a 5 mm atraumatic grasper in the right inferior aspect of the wound.  I turned attention to the right upper quadrant.  The gallbladder fundus was elevated cephalad.   I freed some greater omental adhesions off the ventral surface of the gallbladder to better expose the infundibulum.  I freed the peritoneal coverings between the gallbladder and the liver on the posteriolateral and anteriomedial walls. I alternated between Harmonic & blunt [place] dissection to help get a good critical view of the cystic artery and cystic duct. I did further dissection to free a few centimeters of the  gallbladder off the liver bed to get  a good critical view of the infundibulum and cystic duct. I mobilized the cystic artery; and, after getting a good 360 view, ligated the cystic artery using  the Harmonic ultrasonic dissection. I skeletonized the cystic duct.  I placed a clip on the infundibulum. I did a partial cystic duct-otomy and ensured patency. I placed a 5 [place] cholangiocatheter through a puncture site at the right subcostal ridge of the abdominal wall and directed it into the cystic duct.  We ran a cholangiogram with dilute radio-opaque contrast and continuous fluoroscopy.  Contrast flowed from a side branch consistent with cystic duct cannulization. Contrast flowed up the common hepatic duct into the right and left intrahepatic chains out to secondary radicals. Contrast flowed down the common bile duct easily across the normal ampulla into the duodenum.  The common bile duct was quite dilated but again not obstructed.  Actually refluxed up into the pancreatic duct a little bit.  This was consistent with a normal cholangiogram.  I removed the cholangiocatheter. I placed clips on the cystic duct x4.  I completed cystic duct transection.   Because the cystic duct was rather enlarged and inflamed, I did reinforce the cystic duct with a 0 PDS Endoloop to ligate the stump.  I freed the gallbladder from its remaining attachments to the liver. I ensured hemostasis on the gallbladder fossa of the liver and elsewhere. I inspected the rest of the abdomen & detected no injury nor bleeding elsewhere.  I removed the gallbladder out the supraumbilical fascia. I closed the fascia transversely using #1 PDS interrupted stitches. I closed the skin using 4-0 monocryl stitch.  Sterile dressing was applied. The patient was extubated & arrived in the PACU in stable condition..  I had discussed postoperative care with the patient in the holding area.  I discussed operative findings, updated the patient's status, discussed probable steps to recovery, and gave postoperative recommendations to the patient's spouse.  Recommendations were made.  Questions were answered.  He expressed understanding &  appreciation.   Instructions are written in the chart as well.  Ardeth Sportsman, M.D., F.A.C.S. Gastrointestinal and Minimally Invasive Surgery [facility] [address], [address] [place], [place] [address] [phone] Main / Paging

## 2016-03-07 NOTE — Transfer of Care (Signed)
Immediate Anesthesia Transfer of Care Note  Patient: Tiffany Johns  Procedure(s) Performed: Procedure(s): LAPAROSCOPIC CHOLECYSTECTOMY WITH INTRAOPERATIVE CHOLANGIOGRAM (N/A)  Patient Location: PACU  Anesthesia Type:General  Level of Consciousness: awake, alert  and oriented  Airway & Oxygen Therapy: Patient Spontanous Breathing and Patient connected to face mask oxygen  Post-op Assessment: Report given to RN and Post -op Vital signs reviewed and stable  Post vital signs: Reviewed and stable  Last Vitals:  Vitals:   03/07/16 0632 03/07/16 0834  BP: (!) 95/48 107/76  Pulse: 62 67  Resp: 18 (!) 22  Temp: 36.8 C 36.7 C    Last Pain:  Vitals:   03/07/16 0914  TempSrc:   PainSc: 2       Patients Stated Pain Goal: 4 (03/07/16 0914)  Complications: No apparent anesthesia complications

## 2016-03-08 LAB — COMPREHENSIVE METABOLIC PANEL
ALK PHOS: 58 U/L (ref 38–126)
ALT: 108 U/L — AB (ref 14–54)
AST: 129 U/L — AB (ref 15–41)
Albumin: 3.5 g/dL (ref 3.5–5.0)
Anion gap: 4 — ABNORMAL LOW (ref 5–15)
BUN: 8 mg/dL (ref 6–20)
CALCIUM: 8.6 mg/dL — AB (ref 8.9–10.3)
CHLORIDE: 107 mmol/L (ref 101–111)
CO2: 27 mmol/L (ref 22–32)
CREATININE: 0.6 mg/dL (ref 0.44–1.00)
GFR calc Af Amer: >60 mL/min (ref >60)
Glucose, Bld: 99 mg/dL (ref 65–99)
Potassium: 3.2 mmol/L — ABNORMAL LOW (ref 3.5–5.1)
SODIUM: 138 mmol/L (ref 135–145)
Total Bilirubin: 0.9 mg/dL (ref 0.3–1.2)
Total Protein: 6.5 g/dL (ref 6.5–8.1)

## 2016-03-08 LAB — CBC
HCT: 35.6 % — ABNORMAL LOW (ref 36.0–46.0)
HEMOGLOBIN: 11.9 g/dL — AB (ref 12.0–15.0)
MCH: 29.1 pg (ref 26.0–34.0)
MCHC: 33.4 g/dL (ref 30.0–36.0)
MCV: 87 fL (ref 78.0–100.0)
PLATELETS: 139 10*3/uL — AB (ref 150–400)
RBC: 4.09 MIL/uL (ref 3.87–5.11)
RDW: 13.4 % (ref 11.5–15.5)
WBC: 8.3 10*3/uL (ref 4.0–10.5)

## 2016-03-08 MED ORDER — OXYCODONE-ACETAMINOPHEN 5-325 MG PO TABS: 1.0000 | ORAL_TABLET | ORAL | Status: DC | PRN

## 2016-03-08 MED ORDER — POTASSIUM CHLORIDE CRYS ER 20 MEQ PO TBCR
40.0000 meq | EXTENDED_RELEASE_TABLET | ORAL | Status: AC
  Administered 2016-03-08 (×2): 40 meq via ORAL
  Filled 2016-03-08 (×2): qty 2

## 2016-03-08 NOTE — Progress Notes (Signed)
1 Day Post-Op  Subjective: She is fairly sore and tender postop. Site looks good she is tolerating her diet well, but not eating much.  Objective: Vital signs in last 24 hours: Temp:  [97.3 F (36.3 C)-98.2 F (36.8 C)] 98.2 F (36.8 C) (09/01 0641) Pulse Rate:  [63-81] 63 (09/01 0641) Resp:  [15-27] 18 (09/01 0641) BP: (95-125)/(54-82) 95/54 (09/01 0641) SpO2:  [97 %-100 %] 97 % (09/01 0641)  1100 urine,  Afebrile vital signs stable Labs okay  Intake/Output from previous day: 08/31 0701 - 09/01 0700 In: 1695.8 [I.V.:1695.8] Out: 1125 [Urine:1100; Blood:25] Intake/Output this shift: Total I/O In: -  Out: 500 [Urine:500]  General appearance: alert, cooperative, no distress and Sore GI: Soft tender site looks good, tolerating diet.  Lab Results:   Recent Labs  03/07/16 0549 03/08/16 0528  WBC 6.2 8.3  HGB 11.7* 11.9*  HCT 35.2* 35.6*  PLT 140* 139*    BMET  Recent Labs  03/07/16 0549 03/08/16 0528  NA 140 138  K 3.3* 3.2*  CL 111 107  CO2 25 27  GLUCOSE 112* 99  BUN 6 8  CREATININE 0.52 0.60  CALCIUM 8.4* 8.6*   PT/INR No results for input(s): LABPROT, INR in the last 72 hours.   Recent Labs Lab 03/06/16 0443 03/07/16 0549 03/08/16 0528  AST 43* 36 129*  ALT 27 44 108*  ALKPHOS 52 50 58  BILITOT 0.8 0.7 0.9  PROT 7.0 6.2* 6.5  ALBUMIN 3.7 3.2* 3.5     Lipase     Component Value Date/Time   LIPASE 173 (H) 03/07/2016 0549     Studies/Results: Dg Cholangiogram Operative  Result Date: 03/07/2016 CLINICAL DATA:  Cholecystectomy for cholelithiasis and pancreatitis due to biliary obstruction. EXAM: INTRAOPERATIVE CHOLANGIOGRAM TECHNIQUE: Cholangiographic images from the C-arm fluoroscopic device were submitted for interpretation post-operatively. Please see the procedural report for the amount of contrast and the fluoroscopy time utilized. COMPARISON:  Right upper quadrant abdominal ultrasound and MRI/MRCP on 03/06/2016 FINDINGS:  Intraoperative imaging shows normal opacification of the biliary tree without evidence of filling defect or obstruction. Contrast enters the duodenum normally. There is a mild amount of reflux of contrast into the pancreatic duct. The common bile duct is mildly prominent in caliber without overt dilatation. No contrast extravasation identified. IMPRESSION: Unremarkable intraoperative cholangiogram. Electronically Signed   By: Irish Lack M.D.   On: 03/07/2016 13:15   Mr 3d Recon At Scanner  Result Date: 03/06/2016 CLINICAL DATA:  Pancreatitis secondary to biliary obstruction. Severe right-sided abdominal pain. EXAM: MRI ABDOMEN WITHOUT AND WITH CONTRAST (INCLUDING MRCP) TECHNIQUE: Multiplanar multisequence MR imaging of the abdomen was performed both before and after the administration of intravenous contrast. Heavily T2-weighted images of the biliary and pancreatic ducts were obtained, and three-dimensional MRCP images were rendered by post processing. CONTRAST:  10mL MULTIHANCE GADOBENATE DIMEGLUMINE 529 MG/ML IV SOLN COMPARISON:  Ultrasound of earlier today. FINDINGS: Portions of exam are minimally motion degraded. Lower chest: Normal heart size without pericardial or pleural effusion. Hepatobiliary: Normal liver. Isolated 1.3 cm gallstone within the mid gallbladder. No specific evidence of acute cholecystitis. Intrahepatic ducts are upper normal, including on image 14/series 3. The common duct is minimally dilated for patient age. Measures on the order of 7 mm on image 17/ series 3, image 45/ series 4. Upper normal in this age group 6 mm. No choledocholithiasis. No obstructive mass. Tapers gradually more distally and is followed to the level of the ampulla. Pancreas: Enhances normally. No duct  dilatation. Subtle edema within the anterior pararenal space, including on image 20/series 3 and image 22/series 3. Spleen: Normal in size, without focal abnormality. Adrenals/Urinary Tract: Normal adrenal glands.  Normal kidneys, without hydronephrosis. Stomach/Bowel: Normal stomach and abdominal bowel loops. Vascular/Lymphatic: Normal caliber of the aorta and branch vessels. No retroperitoneal or retrocrural adenopathy. Other: No ascites. Musculoskeletal: No acute osseous abnormality. IMPRESSION: 1. Cholelithiasis. 2. Borderline to minimal biliary duct dilatation, without choledocholithiasis or obstructive mass. Given the normal bilirubin level of earlier today, most likely within normal variation. 3. Subtle findings which likely relate to the clinical history of pancreatitis. Electronically Signed   By: Jeronimo GreavesKyle  Talbot M.D.   On: 03/06/2016 12:06   Mr Roe Coombsbd W/wo Cm/mrcp  Result Date: 03/06/2016 CLINICAL DATA:  Pancreatitis secondary to biliary obstruction. Severe right-sided abdominal pain. EXAM: MRI ABDOMEN WITHOUT AND WITH CONTRAST (INCLUDING MRCP) TECHNIQUE: Multiplanar multisequence MR imaging of the abdomen was performed both before and after the administration of intravenous contrast. Heavily T2-weighted images of the biliary and pancreatic ducts were obtained, and three-dimensional MRCP images were rendered by post processing. CONTRAST:  10mL MULTIHANCE GADOBENATE DIMEGLUMINE 529 MG/ML IV SOLN COMPARISON:  Ultrasound of earlier today. FINDINGS: Portions of exam are minimally motion degraded. Lower chest: Normal heart size without pericardial or pleural effusion. Hepatobiliary: Normal liver. Isolated 1.3 cm gallstone within the mid gallbladder. No specific evidence of acute cholecystitis. Intrahepatic ducts are upper normal, including on image 14/series 3. The common duct is minimally dilated for patient age. Measures on the order of 7 mm on image 17/ series 3, image 45/ series 4. Upper normal in this age group 6 mm. No choledocholithiasis. No obstructive mass. Tapers gradually more distally and is followed to the level of the ampulla. Pancreas: Enhances normally. No duct dilatation. Subtle edema within the anterior  pararenal space, including on image 20/series 3 and image 22/series 3. Spleen: Normal in size, without focal abnormality. Adrenals/Urinary Tract: Normal adrenal glands. Normal kidneys, without hydronephrosis. Stomach/Bowel: Normal stomach and abdominal bowel loops. Vascular/Lymphatic: Normal caliber of the aorta and branch vessels. No retroperitoneal or retrocrural adenopathy. Other: No ascites. Musculoskeletal: No acute osseous abnormality. IMPRESSION: 1. Cholelithiasis. 2. Borderline to minimal biliary duct dilatation, without choledocholithiasis or obstructive mass. Given the normal bilirubin level of earlier today, most likely within normal variation. 3. Subtle findings which likely relate to the clinical history of pancreatitis. Electronically Signed   By: Jeronimo GreavesKyle  Talbot M.D.   On: 03/06/2016 12:06    Medications: . acetaminophen  1,000 mg Oral TID  . enoxaparin (LOVENOX) injection  40 mg Subcutaneous Q24H  . famotidine  40 mg Oral QHS  . lip balm  1 application Topical BID  . [START ON 03/09/2016] pantoprazole  40 mg Intravenous Q12H  . potassium chloride  40 mEq Oral Q4H  . saccharomyces boulardii  250 mg Oral BID  . sodium chloride flush  3 mL Intravenous Q12H   . dextrose 5% lactated ringers 50 mL/hr at 03/07/16 1505   Assessment/Plan Gallstone pancreatitis Status post laparoscopic cholecystectomy with intraoperative cholangiogram 03/07/16 Dr. Karie SodaSteven Gross FEN:  IV fluids/heart healthy diet ID: Pre op only DVT:  Lovenox    Plan: She can be discharged when tolerating PO's well, pain control with oral pain medication, voiding and ambulating without difficulty. Follow-up information is in the AVS along with discharge instructions. She can have plain Tylenol or ibuprofen for pain. She also has Percocet ordered for pain now.  LOS: 2 days    Zaiyah Sottile 03/08/2016 [phone]

## 2016-03-08 NOTE — Discharge Summary (Signed)
Tiffany Johns, is a 35 y.o. female  DOB 01-06-1981  MRN [id number].  Admission date:  03/06/2016  Admitting Physician  Alberteen Sam, MD  Discharge Date:  03/08/2016   Primary MD  Lora Paula, MD  Recommendations for primary care physician for things to follow:  - Check CBC, CMP, lipase liver during next visit - Sent to follow with general surgery as an appointment scheduled, please see below   Admission Diagnosis  Acute gallstone pancreatitis [K85.10]   Discharge Diagnosis  Acute gallstone pancreatitis [K85.10]    Principal Problem:   Gallstone Pancreatitis s/p lap cholecystectomy 03/07/2016 Active Problems:   Gallstones   Acute pancreatitis      Past Medical History:  Diagnosis Date  . Cholelithiases   . Hypotension   . Pancreatitis     Past Surgical History:  Procedure Laterality Date  . CESAREAN SECTION  x2  . CHOLECYSTECTOMY N/A 03/07/2016   Procedure: LAPAROSCOPIC CHOLECYSTECTOMY WITH INTRAOPERATIVE CHOLANGIOGRAM;  Surgeon: Karie Soda, MD;  Location: [facility];  Service: General;  Laterality: N/A;  . ESOPHAGOGASTRODUODENOSCOPY  09/20/2011   Procedure: ESOPHAGOGASTRODUODENOSCOPY (EGD);  Surgeon: Beverley Fiedler, MD;  Location: [facility] ENDOSCOPY;  Service: Gastroenterology;  Laterality: N/A;       History of present illness and  Hospital Course:     Kindly see H&P for history of present illness and admission details, please review complete Labs, Consult reports and Test reports for all details in brief  HPI  from the history and physical done on the day of admission 03/06/2016 Tiffany Johns is a 35 y.o. female with medical history significant of gallstones. Presents with significant abdominal pain, located in the epigastric region and right upper quadrant, 10 out of 10 intensity, colicky in nature, no improving or worsening factors, associated with nausea and vomiting. Due to  significant pain she called EMS and she was brought into the hospital for further evaluation.  She has been diagnosed with gallstones recently, she has had 3 visits to the emergency department over last month for biliary colic, she has been referred to the outpatient surgical office to schedule a possible cholecystectomy. Due to insurance issues she has been unable to arrange the appropriate follow-up.  ED Course: Supportive IV fluids, hydromorphone, ultrasonography of the abdomen confirming gallstone near the neck of the gallbladder.    Hospital Course  35 year old female with recurrent admission for biliary colic, admitted with gallstone pancreatitis, went for laparoscopic cholecystectomy 8/31.  Gallstone pancreatitis - MRCP showed without choledocholithiasis or obstructive mass - Ultrasound showed Cholelithiasis without other evidence of acute cholecystitis - elevated lipase 2068 and admission, trending down, was 178 yesterday. - Surgical input greatly appreciated, went  laparoscopic cholecystectomy with IOC 8/31, tolerating advanced diet, pain better controlled, ambulating well, can be discharged to follow with surgery as an outpatient.  Cholelithiasis  - Ultrasound with evidence of 1.2 cm stone in gallbladder , please see above  Hypotension. - Resolved with hydration  Hypokalemia.  - Repleted   Discharge Condition:  stable  Follow UP  Follow-up Information    [facility] Follow up on 03/27/2016.   Specialty:  General Surgery Why:  Your appointment is at 8:45 AM, be at the office 30 minutes early for check-in. Contact information: [address] [place] [address] [phone]             Discharge Instructions  and  Discharge Medications     Discharge Instructions    Discharge instructions    Complete by:  As directed   Follow with Primary MD Lora Paula, MD in 7 days   Get CBC, CMP, checked  by Primary MD next visit.     Activity: As tolerated with Full fall precautions use walker/cane & assistance as needed   Disposition Home    Diet: Gradually transition to a high fiber diet with a fiber supplement over the next few weeks after discharge.  Start with a pureed / full liquid diet   For Heart failure patients - Check your Weight same time everyday, if you gain over 2 pounds, or you develop in leg swelling, experience more shortness of breath or chest pain, call your Primary MD immediately. Follow Cardiac Low Salt Diet and 1.5 lit/day fluid restriction.   On your next visit with your primary care physician please Get Medicines reviewed and adjusted.   Please request your Prim.MD to go over all Hospital Tests and Procedure/Radiological results at the follow up, please get all Hospital records sent to your Prim MD by signing hospital release before you go home.   If you experience worsening of your admission symptoms, develop shortness of breath, life threatening emergency, suicidal or homicidal thoughts you must seek medical attention immediately by calling 911 or calling your MD immediately  if symptoms less severe.  You Must read complete instructions/literature along with all the possible adverse reactions/side effects for all the Medicines you take and that have been prescribed to you. Take any new Medicines after you have completely understood and accpet all the possible adverse reactions/side effects.   Do not drive, operating heavy machinery, perform activities at heights, swimming or participation in water activities or provide baby sitting services if your were admitted for syncope or siezures until you have seen by Primary MD or a Neurologist and advised to do so again.  Do not drive when taking Pain medications.    Do not take more than prescribed Pain, Sleep and Anxiety Medications  Special Instructions: If you have smoked or chewed Tobacco  in the last 2 yrs please stop smoking, stop any  regular Alcohol  and or any Recreational drug use.  Wear Seat belts while driving.   Please note  You were cared for by a hospitalist during your hospital stay. If you have any questions about your discharge medications or the care you received while you were in the hospital after you are discharged, you can call the unit and asked to speak with the hospitalist on call if the hospitalist that took care of you is not available. Once you are discharged, your primary care physician will handle any further medical issues. Please note that NO REFILLS for any discharge medications will be authorized once you are discharged, as it is imperative that you return to your primary care physician (or establish a relationship with a primary care physician if you do not have one) for your aftercare needs so that they can reassess your need for medications and monitor your lab values.   Increase  activity slowly    Complete by:  As directed       Medication List    TAKE these medications   acetaminophen-codeine 300-30 MG tablet Commonly known as:  TYLENOL #3 Take 1 tablet by mouth every 8 (eight) hours as needed for moderate pain.   ondansetron 4 MG tablet Commonly known as:  ZOFRAN Take 1 tablet (4 mg total) by mouth every 8 (eight) hours as needed for nausea or vomiting.   oxyCODONE-acetaminophen 5-325 MG tablet Commonly known as:  PERCOCET/ROXICET Take 1-2 tablets by mouth every 4 (four) hours as needed for moderate pain or severe pain.   ranitidine 150 MG tablet Commonly known as:  ZANTAC Take 1 tablet (150 mg total) by mouth at bedtime.         Diet and Activity recommendation: See Discharge Instructions above   Consults obtained -  General surgery   Major procedures and Radiology Reports - PLEASE review detailed and final reports for all details, in brief -  Lap  cholecystectomy on 8/31   Dg Cholangiogram Operative  Result Date: 03/07/2016 CLINICAL DATA:  Cholecystectomy for  cholelithiasis and pancreatitis due to biliary obstruction. EXAM: INTRAOPERATIVE CHOLANGIOGRAM TECHNIQUE: Cholangiographic images from the C-arm fluoroscopic device were submitted for interpretation post-operatively. Please see the procedural report for the amount of contrast and the fluoroscopy time utilized. COMPARISON:  Right upper quadrant abdominal ultrasound and MRI/MRCP on 03/06/2016 FINDINGS: Intraoperative imaging shows normal opacification of the biliary tree without evidence of filling defect or obstruction. Contrast enters the duodenum normally. There is a mild amount of reflux of contrast into the pancreatic duct. The common bile duct is mildly prominent in caliber without overt dilatation. No contrast extravasation identified. IMPRESSION: Unremarkable intraoperative cholangiogram. Electronically Signed   By: Irish Lack M.D.   On: 03/07/2016 13:15   Mr 3d Recon At Scanner  Result Date: 03/06/2016 CLINICAL DATA:  Pancreatitis secondary to biliary obstruction. Severe right-sided abdominal pain. EXAM: MRI ABDOMEN WITHOUT AND WITH CONTRAST (INCLUDING MRCP) TECHNIQUE: Multiplanar multisequence MR imaging of the abdomen was performed both before and after the administration of intravenous contrast. Heavily T2-weighted images of the biliary and pancreatic ducts were obtained, and three-dimensional MRCP images were rendered by post processing. CONTRAST:  10mL MULTIHANCE GADOBENATE DIMEGLUMINE 529 MG/ML IV SOLN COMPARISON:  Ultrasound of earlier today. FINDINGS: Portions of exam are minimally motion degraded. Lower chest: Normal heart size without pericardial or pleural effusion. Hepatobiliary: Normal liver. Isolated 1.3 cm gallstone within the mid gallbladder. No specific evidence of acute cholecystitis. Intrahepatic ducts are upper normal, including on image 14/series 3. The common duct is minimally dilated for patient age. Measures on the order of 7 mm on image 17/ series 3, image 45/ series 4. Upper  normal in this age group 6 mm. No choledocholithiasis. No obstructive mass. Tapers gradually more distally and is followed to the level of the ampulla. Pancreas: Enhances normally. No duct dilatation. Subtle edema within the anterior pararenal space, including on image 20/series 3 and image 22/series 3. Spleen: Normal in size, without focal abnormality. Adrenals/Urinary Tract: Normal adrenal glands. Normal kidneys, without hydronephrosis. Stomach/Bowel: Normal stomach and abdominal bowel loops. Vascular/Lymphatic: Normal caliber of the aorta and branch vessels. No retroperitoneal or retrocrural adenopathy. Other: No ascites. Musculoskeletal: No acute osseous abnormality. IMPRESSION: 1. Cholelithiasis. 2. Borderline to minimal biliary duct dilatation, without choledocholithiasis or obstructive mass. Given the normal bilirubin level of earlier today, most likely within normal variation. 3. Subtle findings which likely relate to the clinical history  of pancreatitis. Electronically Signed   By: Jeronimo Greaves M.D.   On: 03/06/2016 12:06   US Abdomen Limited  Result Date: 03/06/2016 CLINICAL DATA:  Sudden onset right upper quadrant pain EXAM: US ABDOMEN LIMITED - RIGHT UPPER QUADRANT COMPARISON:  Abdominal ultrasound 11/15/2015 FINDINGS: Gallbladder: There is a 1.2 cm stone in shows limited mobility, near the gallbladder neck. Small gravel like stones are also seen with the patient in left lateral decubitus position. There is no gallbladder wall thickening or pericholecystic fluid. No positive sonographic Eulah Pont sign was demonstrated by the sonographer. 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 Common bile duct: Diameter: 6.5 mm proximally, 4.6 mm distally Liver: Increased echogenicity may indicate hepatic steatosis. No focal lesion. IMPRESSION: 1. Cholelithiasis without other evidence of acute cholecystitis. 2. 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 Electronically Signed   By: Deatra Robinson M.D.   On: 03/06/2016 05:44   Mr Roe Coombs W/wo Cm/mrcp  Result Date: 03/06/2016 CLINICAL DATA:  Pancreatitis secondary to biliary obstruction. Severe right-sided abdominal pain. EXAM: MRI ABDOMEN WITHOUT AND WITH CONTRAST (INCLUDING MRCP) TECHNIQUE: Multiplanar multisequence MR imaging of the abdomen was performed both before and after the administration of intravenous contrast. Heavily T2-weighted images of the biliary and pancreatic ducts were obtained, and three-dimensional MRCP images were rendered by post processing. CONTRAST:  10mL MULTIHANCE GADOBENATE DIMEGLUMINE 529 MG/ML IV SOLN COMPARISON:  Ultrasound of earlier today. FINDINGS: Portions of exam are minimally motion degraded. Lower chest: Normal heart size without pericardial or pleural effusion. Hepatobiliary: Normal liver. Isolated 1.3 cm gallstone within the mid gallbladder. No specific evidence of acute cholecystitis. Intrahepatic ducts are upper normal, including on image 14/series 3. The common duct is minimally dilated for patient age. Measures on the order of 7 mm on image 17/ series 3, image 45/ series 4. Upper normal in this age group 6 mm. No choledocholithiasis. No obstructive mass. Tapers gradually more distally and is followed to the level of the ampulla. Pancreas: Enhances normally. No duct dilatation. Subtle edema within the anterior pararenal space, including on image 20/series 3 and image 22/series 3. Spleen: Normal in size, without focal abnormality. Adrenals/Urinary Tract: Normal adrenal glands. Normal kidneys, without hydronephrosis. Stomach/Bowel: Normal stomach and abdominal bowel loops. Vascular/Lymphatic: Normal caliber of the aorta and branch vessels. No retroperitoneal or retrocrural adenopathy. Other: No ascites. Musculoskeletal: No acute osseous abnormality. IMPRESSION: 1. Cholelithiasis. 2. Borderline to minimal biliary duct  dilatation, without choledocholithiasis or obstructive mass. Given the normal bilirubin level of earlier today, most likely within normal variation. 3. Subtle findings which likely relate to the clinical history of pancreatitis. Electronically Signed   By: Jeronimo Greaves M.D.   On: 03/06/2016 12:06    Micro Results     Recent Results (from the past 240 hour(s))  Surgical pcr screen     Status: None   Collection Time: 03/07/16  5:58 AM  Result Value Ref Range Status   MRSA, PCR NEGATIVE NEGATIVE Final   Staphylococcus aureus NEGATIVE NEGATIVE Final    Comment:        The Xpert SA Assay (FDA approved for NASAL specimens in patients over 58 years of age), is one component of a comprehensive surveillance program.  Test performance has been validated by [facility] for patients greater than or equal to 64 year old. It is not intended to diagnose infection nor to guide or monitor treatment.  Today   Subjective:   Tiffany Johns today has no headache,no chest  painProximal nausea, no vomiting, as well mild abdominal pain . Objective:   Blood pressure (!) 95/54, pulse 63, temperature 98.2 F (36.8 C), temperature source Oral, resp. rate 18, height 4\' 5"  (1.346 m), weight 54.5 kg (120 lb 2.4 oz), last menstrual period 03/06/2016, SpO2 97 %.   Intake/Output Summary (Last 24 hours) at 03/08/16 1422 Last data filed at 03/08/16 0800  Gross per 24 hour  Intake           695.83 ml  Output              950 ml  Net          -254.17 ml    Exam Awake Alert, Oriented x 3 Supple Neck,No JVD Symmetrical Chest wall movement, Good air movement bilaterally, CTAB RRR,No Gallops,Rubs or new Murmurs, No Parasternal Heave +ve B.Sounds, Abd Soft,Mild tenderness, No rebound -guarding or rigidity. No Cyanosis, Clubbing or edema, No new Rash or bruise  Data Review   CBC w Diff:  Lab Results  Component Value Date   WBC 8.3 03/08/2016   HGB 11.9 (L) 03/08/2016   HCT 35.6 (L)  03/08/2016   PLT 139 (L) 03/08/2016   LYMPHOPCT 28 11/15/2015   MONOPCT 9 11/15/2015   EOSPCT 3 11/15/2015   BASOPCT 0 11/15/2015    CMP:  Lab Results  Component Value Date   NA 138 03/08/2016   K 3.2 (L) 03/08/2016   CL 107 03/08/2016   CO2 27 03/08/2016   BUN 8 03/08/2016   CREATININE 0.60 03/08/2016   CREATININE 0.65 02/02/2016   PROT 6.5 03/08/2016   ALBUMIN 3.5 03/08/2016   BILITOT 0.9 03/08/2016   ALKPHOS 58 03/08/2016   AST 129 (H) 03/08/2016   ALT 108 (H) 03/08/2016  .   Total Time in preparing paper work, data evaluation and todays exam - 35 minutes  ELGERGAWY, DAWOOD M.D on 03/08/2016 at 2:22 PM  [facility]   Office  [phone]

## 2017-07-25 ENCOUNTER — Other Ambulatory Visit: Payer: Self-pay

## 2017-07-25 ENCOUNTER — Encounter (HOSPITAL_COMMUNITY): Payer: Self-pay | Admitting: Emergency Medicine

## 2017-07-25 DIAGNOSIS — Z79899 Other long term (current) drug therapy: Secondary | ICD-10-CM | POA: Insufficient documentation

## 2017-07-25 DIAGNOSIS — R51 Headache: Secondary | ICD-10-CM | POA: Insufficient documentation

## 2017-07-25 LAB — BASIC METABOLIC PANEL WITH GFR
Anion gap: 9 (ref 5–15)
BUN: 11 mg/dL (ref 6–20)
CO2: 21 mmol/L — ABNORMAL LOW (ref 22–32)
Calcium: 8.4 mg/dL — ABNORMAL LOW (ref 8.9–10.3)
Chloride: 109 mmol/L (ref 101–111)
Creatinine, Ser: 0.56 mg/dL (ref 0.44–1.00)
GFR calc Af Amer: >60 mL/min
GFR calc non Af Amer: >60 mL/min
Glucose, Bld: 93 mg/dL (ref 65–99)
Potassium: 3.5 mmol/L (ref 3.5–5.1)
Sodium: 139 mmol/L (ref 135–145)

## 2017-07-25 LAB — CBC
HCT: 36.4 % (ref 36.0–46.0)
Hemoglobin: 12.2 g/dL (ref 12.0–15.0)
MCH: 29 pg (ref 26.0–34.0)
MCHC: 33.5 g/dL (ref 30.0–36.0)
MCV: 86.7 fL (ref 78.0–100.0)
Platelets: 204 10*3/uL (ref 150–400)
RBC: 4.2 MIL/uL (ref 3.87–5.11)
RDW: 12.7 % (ref 11.5–15.5)
WBC: 9.5 10*3/uL (ref 4.0–10.5)

## 2017-07-25 LAB — I-STAT BETA HCG BLOOD, ED (MC, WL, AP ONLY): I-stat hCG, quantitative: <5 m[IU]/mL (ref <5)

## 2017-07-25 NOTE — ED Triage Notes (Signed)
Pt reports headache x2-3 years, states "booming" on R side. No change today that prompted ED visit.

## 2017-07-26 ENCOUNTER — Emergency Department (HOSPITAL_COMMUNITY)
Admission: EM | Admit: 2017-07-26 | Discharge: 2017-07-26 | Disposition: A | Discharge status: Home / Self Care | Payer: BLUE CROSS/BLUE SHIELD | Attending: Emergency Medicine | Admitting: Emergency Medicine

## 2017-07-26 DIAGNOSIS — R51 Headache: Secondary | ICD-10-CM

## 2017-07-26 DIAGNOSIS — R519 Headache, unspecified: Secondary | ICD-10-CM

## 2017-07-26 MED ORDER — NAPROXEN 250 MG PO TABS
500.0000 mg | ORAL_TABLET | Freq: Once | ORAL | Status: AC
  Administered 2017-07-26: 500 mg via ORAL
  Filled 2017-07-26: qty 2

## 2017-07-26 MED ORDER — NAPROXEN 500 MG PO TABS: 500.0000 mg | ORAL_TABLET | Freq: Two times a day (BID) | ORAL | 0 refills | Status: DC

## 2017-07-26 MED ORDER — ACETAMINOPHEN 325 MG PO TABS: 650.0000 mg | ORAL_TABLET | Freq: Four times a day (QID) | ORAL | 0 refills | Status: DC | PRN

## 2017-07-26 NOTE — Discharge Instructions (Signed)
Please take the medicine prescribed for your headache.  Please call the phone numbers provided for doctor's appointments.  Dr. Dione BoozeGroat is an eye specialist who can help you with I prescription.

## 2017-07-26 NOTE — ED Provider Notes (Signed)
[facility] Provider Note   CSN: [id number] Arrival date & time: 07/25/17  2001     History   Chief Complaint Chief Complaint  Patient presents with  . Headache    HPI Autumn MessingLydia Glidden is a 37 y.o. female.  HPI 37 year old female with history of cholelithiasis, pancreatitis comes in with chief complaint of headache.  Patient reports that she has had a headaches for 6 years.  Patient gets headaches once every 2-3 weeks and the current headaches started 3 days ago.  Headaches are located on the right side, and described as throbbing and severe headaches.  Patient has associated pain around the eye, with no change in her vision.  Patient also denies any pain with eye movement.  Patient's headache is worse with light.  Patient reports [place] eye prescription which had helped her, but she doesn't have the glasses here.  Pt has no associated nausea, vomiting, seizures, loss of consciousness or new visual complains, weakness, numbness, dizziness or gait instability. Patient's headaches are not necessarily worse at nighttime.  She denies any family history of brain bleeds or brain aneurysm.  Past Medical History:  Diagnosis Date  . Cholelithiases   . Hypotension   . Pancreatitis     Patient Active Problem List   Diagnosis Date Noted  . Acute pancreatitis 03/06/2016  . Gallstone Pancreatitis s/p lap cholecystectomy 03/07/2016 03/06/2016  . Gallstones 02/02/2016  . Headache 07/15/2013    Past Surgical History:  Procedure Laterality Date  . CESAREAN SECTION  x2  . CHOLECYSTECTOMY N/A 03/07/2016   Procedure: LAPAROSCOPIC CHOLECYSTECTOMY WITH INTRAOPERATIVE CHOLANGIOGRAM;  Surgeon: Karie SodaSteven Gross, MD;  Location: [facility];  Service: General;  Laterality: N/A;  . ESOPHAGOGASTRODUODENOSCOPY  09/20/2011   Procedure: ESOPHAGOGASTRODUODENOSCOPY (EGD);  Surgeon: Beverley FiedlerJay M Pyrtle, MD;  Location: [facility] ENDOSCOPY;  Service:  Gastroenterology;  Laterality: N/A;    OB History    No data available       Home Medications    Prior to Admission medications   Medication Sig Start Date End Date Taking? Authorizing Provider  acetaminophen (TYLENOL) 325 MG tablet Take 2 tablets (650 mg total) by mouth every 6 (six) hours as needed. 07/26/17   Derwood KaplanNanavati, Gillermo Poch, MD  acetaminophen-codeine (TYLENOL #3) 300-30 MG tablet Take 1 tablet by mouth every 8 (eight) hours as needed for moderate pain. Patient not taking: Reported on 03/06/2016 02/02/16   Dessa PhiFunches, Josalyn, MD  naproxen (NAPROSYN) 500 MG tablet Take 1 tablet (500 mg total) by mouth 2 (two) times daily. 07/26/17   Derwood KaplanNanavati, Sedalia Greeson, MD  ondansetron (ZOFRAN) 4 MG tablet Take 1 tablet (4 mg total) by mouth every 8 (eight) hours as needed for nausea or vomiting. 02/02/16   Dessa PhiFunches, Josalyn, MD  oxyCODONE-acetaminophen (PERCOCET/ROXICET) 5-325 MG tablet Take 1-2 tablets by mouth every 4 (four) hours as needed for moderate pain or severe pain. 02/18/16   Lorre NickAllen, Anthony, MD  ranitidine (ZANTAC) 150 MG tablet Take 1 tablet (150 mg total) by mouth at bedtime. 02/02/16   Dessa PhiFunches, Josalyn, MD    Family History Family History  Problem Relation Age of Onset  . Anesthesia problems Neg Hx   . Hypotension Neg Hx   . Malignant hyperthermia Neg Hx   . Pseudochol deficiency Neg Hx     Social History Social History   Tobacco Use  . Smoking status: Never Smoker  . Smokeless tobacco: Never Used  Substance Use Topics  . Alcohol use: No  .  Drug use: No     Allergies   Patient has no known allergies.   Review of Systems Review of Systems  Constitutional: Negative for activity change.  Eyes: Positive for photophobia. Negative for visual disturbance.  Musculoskeletal: Negative for neck pain and neck stiffness.  Allergic/Immunologic: Negative for immunocompromised state.  Neurological: Positive for headaches. Negative for numbness.  Hematological: Does not bruise/bleed easily.      Physical Exam Updated Vital Signs BP (!) 113/93 (BP Location: Right Arm)   Pulse 72   Temp 98.5 F (36.9 C) (Oral)   Resp 14   Ht 4\' 5"  (1.346 m)   Wt 54.4 kg (120 lb)   LMP 07/25/2017 (Exact Date)   SpO2 100%   BMI 30.04 kg/m   Physical Exam  Constitutional: She is oriented to person, place, and time. She appears well-developed.  HENT:  Head: Normocephalic and atraumatic.  Eyes: EOM are normal. Pupils are equal, round, and reactive to light.  Neck: Normal range of motion. Neck supple.  Cardiovascular: Normal rate.  Pulmonary/Chest: Effort normal.  Abdominal: Bowel sounds are normal.  Neurological: She is alert and oriented to person, place, and time. She is not disoriented. No cranial nerve deficit or sensory deficit.  Skin: Skin is warm and dry.  Nursing note and vitals reviewed.    ED Treatments / Results  Labs (all labs ordered are listed, but only abnormal results are displayed) Labs Reviewed  BASIC METABOLIC PANEL - Abnormal; Notable for the following components:      Result Value   CO2 21 (*)    Calcium 8.4 (*)    All other components within normal limits  CBC  I-STAT BETA HCG BLOOD, ED ([facility], [facility], [facility] ONLY)    EKG  EKG Interpretation None       Radiology No results found.  Procedures Procedures (including critical care time)  Medications Ordered in ED Medications  naproxen (NAPROSYN) tablet 500 mg (not administered)     Initial Impression / Assessment and Plan / ED Course  I have reviewed the triage vital signs and the nursing notes.  Pertinent labs & imaging results that were available during my care of the patient were reviewed by me and considered in my medical decision making (see chart for details).     37 year old female comes in with chief complaint of headaches.  Patient's headaches have been present for several years, and there is no associated red flags to be concerned about elevated intracranial pressures at this time.   Patient does report that she used to wear eye glasses, but does not have those glasses in the [place].  I suspect there could be a possibility that she has near or farsightedness which could be causing headache.  No further imaging is indicated in the ER.  Patient will be given oral pain medicine and discharged with hospital follow-up.  Final Clinical Impressions(s) / ED Diagnoses   Final diagnoses:  Bad headache    ED Discharge Orders        Ordered    naproxen (NAPROSYN) 500 MG tablet  2 times daily     07/26/17 0422    acetaminophen (TYLENOL) 325 MG tablet  Every 6 hours PRN     07/26/17 0422       Derwood Kaplan, MD 07/26/17 [phone]

## 2018-02-17 ENCOUNTER — Ambulatory Visit (HOSPITAL_COMMUNITY)
Admission: EM | Admit: 2018-02-17 | Discharge: 2018-02-17 | Disposition: A | Discharge status: Home / Self Care | Payer: BLUE CROSS/BLUE SHIELD | Attending: Family Medicine | Admitting: Family Medicine

## 2018-02-17 ENCOUNTER — Encounter (HOSPITAL_COMMUNITY): Payer: Self-pay

## 2018-02-17 DIAGNOSIS — B349 Viral infection, unspecified: Secondary | ICD-10-CM

## 2018-02-17 DIAGNOSIS — Z9049 Acquired absence of other specified parts of digestive tract: Secondary | ICD-10-CM | POA: Diagnosis not present

## 2018-02-17 DIAGNOSIS — R509 Fever, unspecified: Secondary | ICD-10-CM | POA: Diagnosis present

## 2018-02-17 DIAGNOSIS — Z79899 Other long term (current) drug therapy: Secondary | ICD-10-CM | POA: Diagnosis not present

## 2018-02-17 LAB — POCT RAPID STREP A: STREPTOCOCCUS, GROUP A SCREEN (DIRECT): NEGATIVE

## 2018-02-17 MED ORDER — NAPROXEN 500 MG PO TABS
500.0000 mg | ORAL_TABLET | Freq: Two times a day (BID) | ORAL | 0 refills | Status: AC
Start: 2018-02-17 — End: ?

## 2018-02-17 MED ORDER — FLUTICASONE PROPIONATE 50 MCG/ACT NA SUSP: 2.0000 | Freq: Every day | NASAL | 0 refills | Status: AC

## 2018-02-17 MED ORDER — ACETAMINOPHEN 325 MG PO TABS: 650.0000 mg | ORAL_TABLET | Freq: Four times a day (QID) | ORAL | 0 refills | Status: DC | PRN

## 2018-02-17 NOTE — ED Triage Notes (Signed)
Pt presents with ongoing fever, headache, body ache and pain in front of neck just below ears

## 2018-02-17 NOTE — Discharge Instructions (Addendum)
Rapid strep negative. Symptoms are most likely due to viral illness. Flonase for possible sinus pressure. Continue naproxen for pain. Can supplement with tylenol. Monitor for any worsening of symptoms, swelling of the throat, trouble breathing, trouble swallowing, leaning forward to breath, drooling, go to the emergency department for further evaluation needed.  Monitor for any worsening of symptoms, chest pain, shortness of breath, wheezing, swelling of the throat, follow up for reevaluation.   For sore throat/cough try using a honey-based tea. Use 3 teaspoons of honey with juice squeezed from half lemon. Place shaved pieces of ginger into 1/2-1 cup of water and warm over stove top. Then mix the ingredients and repeat every 4 hours as needed.

## 2018-02-17 NOTE — ED Provider Notes (Signed)
[facility]    CSN: [id number] Arrival date & time: 02/17/18  1618     History   Chief Complaint Chief Complaint  Patient presents with  . Fever    HPI Tiffany Johns is a 37 y.o. female.   37 year old female comes in for 2-day history of fever, headache, body ache.  States has also had a sore throat.  Denies cough, rhinorrhea, nasal congestion.  Fever is subjective.  Headache is intermittent, mostly frontal, but can go to different locations.  Denies nausea, vomiting, abdominal pain.  Denies photophobia, phonophobia.  Has been eating and drinking without difficulty.  Took naproxen with some relief.  Denies tick bites.  Denies sick contact.  Never smoker.     Past Medical History:  Diagnosis Date  . Cholelithiases   . Hypotension   . Pancreatitis     Patient Active Problem List   Diagnosis Date Noted  . Acute pancreatitis 03/06/2016  . Gallstone Pancreatitis s/p lap cholecystectomy 03/07/2016 03/06/2016  . Gallstones 02/02/2016  . Headache 07/15/2013    Past Surgical History:  Procedure Laterality Date  . CESAREAN SECTION  x2  . CHOLECYSTECTOMY N/A 03/07/2016   Procedure: LAPAROSCOPIC CHOLECYSTECTOMY WITH INTRAOPERATIVE CHOLANGIOGRAM;  Surgeon: Karie SodaSteven Gross, MD;  Location: [facility];  Service: General;  Laterality: N/A;  . ESOPHAGOGASTRODUODENOSCOPY  09/20/2011   Procedure: ESOPHAGOGASTRODUODENOSCOPY (EGD);  Surgeon: Beverley FiedlerJay M Pyrtle, MD;  [facility];  Service: Gastroenterology;  Laterality: N/A;    OB History   None      Home Medications    Prior to Admission medications   Medication Sig Start Date End Date Taking? Authorizing Provider  acetaminophen (TYLENOL) 325 MG tablet Take 2 tablets (650 mg total) by mouth every 6 (six) hours as needed. 02/17/18   Cathie HoopsYu, Leemon Ayala V, PA-C  acetaminophen-codeine (TYLENOL #3) 300-30 MG tablet Take 1 tablet by mouth every 8 (eight) hours as needed for moderate pain. Patient not taking: Reported on 03/06/2016 02/02/16    Dessa PhiFunches, Josalyn, MD  fluticasone (FLONASE) 50 MCG/ACT nasal spray Place 2 sprays into both nostrils daily. 02/17/18   Cathie HoopsYu, Andie Mungin V, PA-C  naproxen (NAPROSYN) 500 MG tablet Take 1 tablet (500 mg total) by mouth 2 (two) times daily. 02/17/18   Cathie HoopsYu, Square Jowett V, PA-C  ondansetron (ZOFRAN) 4 MG tablet Take 1 tablet (4 mg total) by mouth every 8 (eight) hours as needed for nausea or vomiting. 02/02/16   Dessa PhiFunches, Josalyn, MD  oxyCODONE-acetaminophen (PERCOCET/ROXICET) 5-325 MG tablet Take 1-2 tablets by mouth every 4 (four) hours as needed for moderate pain or severe pain. 02/18/16   Lorre NickAllen, Anthony, MD  ranitidine (ZANTAC) 150 MG tablet Take 1 tablet (150 mg total) by mouth at bedtime. 02/02/16   Dessa PhiFunches, Josalyn, MD    Family History Family History  Problem Relation Age of Onset  . Anesthesia problems Neg Hx   . Hypotension Neg Hx   . Malignant hyperthermia Neg Hx   . Pseudochol deficiency Neg Hx     Social History Social History   Tobacco Use  . Smoking status: Never Smoker  . Smokeless tobacco: Never Used  Substance Use Topics  . Alcohol use: No  . Drug use: No     Allergies   Patient has no known allergies.   Review of Systems Review of Systems  Reason unable to perform ROS: See HPI as above.     Physical Exam Triage Vital Signs ED Triage Vitals [02/17/18 1645]  Enc Vitals Group     BP  106/76     Pulse Rate 72     Resp 18     Temp 99 F (37.2 C)     Temp Source Oral     SpO2 100 %     Weight      Height      Head Circumference      Peak Flow      Pain Score      Pain Loc      Pain Edu?      Excl. in GC?    No data found.  Updated Vital Signs BP 106/76 (BP Location: Right Arm)   Pulse 72   Temp 99 F (37.2 C) (Oral)   Resp 18   LMP 02/10/2018   SpO2 100%   Physical Exam  Constitutional: She is oriented to person, place, and time. She appears well-developed and well-nourished. No distress.  HENT:  Head: Normocephalic and atraumatic.  Right Ear: Tympanic  membrane, external ear and ear canal normal. Tympanic membrane is not erythematous and not bulging.  Left Ear: Tympanic membrane, external ear and ear canal normal. Tympanic membrane is not erythematous and not bulging.  Nose: Nose normal. Right sinus exhibits no maxillary sinus tenderness and no frontal sinus tenderness. Left sinus exhibits no maxillary sinus tenderness and no frontal sinus tenderness.  Mouth/Throat: Uvula is midline and mucous membranes are normal. Posterior oropharyngeal erythema present. No tonsillar exudate.  Eyes: Pupils are equal, round, and reactive to light. Conjunctivae are normal.  Neck: Normal range of motion. Neck supple.  Cardiovascular: Normal rate, regular rhythm and normal heart sounds. Exam reveals no gallop and no friction rub.  No murmur heard. Pulmonary/Chest: Effort normal and breath sounds normal. She has no decreased breath sounds. She has no wheezes. She has no rhonchi. She has no rales.  Lymphadenopathy:    She has no cervical adenopathy.  Neurological: She is alert and oriented to person, place, and time.  Skin: Skin is warm and dry.  Psychiatric: She has a normal mood and affect. Her behavior is normal. Judgment normal.     UC Treatments / Results  Labs (all labs ordered are listed, but only abnormal results are displayed) Labs Reviewed  CULTURE, GROUP A STREP [facility])  POCT RAPID STREP A    EKG None  Radiology No results found.  Procedures Procedures (including critical care time)  Medications Ordered in UC Medications - No data to display  Initial Impression / Assessment and Plan / UC Course  I have reviewed the triage vital signs and the nursing notes.  Pertinent labs & imaging results that were available during my care of the patient were reviewed by me and considered in my medical decision making (see chart for details).    Rapid strep negative. Patient is nontoxic in appearance. Symptomatic treatment as needed. Return  precautions given.   Final Clinical Impressions(s) / UC Diagnoses   Final diagnoses:  Viral illness    ED Prescriptions    Medication Sig Dispense Auth. Provider   fluticasone (FLONASE) 50 MCG/ACT nasal spray Place 2 sprays into both nostrils daily. 1 g Keilynn Marano V, PA-C   naproxen (NAPROSYN) 500 MG tablet Take 1 tablet (500 mg total) by mouth 2 (two) times daily. 30 tablet Averill Winters V, PA-C   acetaminophen (TYLENOL) 325 MG tablet Take 2 tablets (650 mg total) by mouth every 6 (six) hours as needed. 30 tablet Belinda FisherYu, Harjit Douds V, PA-C        Belinda FisherYu, Yomar Mejorado V, PA-C  02/17/18 1733  

## 2018-02-20 LAB — CULTURE, GROUP A STREP (THRC)

## 2018-02-26 ENCOUNTER — Telehealth (HOSPITAL_COMMUNITY): Payer: Self-pay

## 2018-02-26 NOTE — Telephone Encounter (Signed)
Culture is positive for non group A Strep germ.  This is a finding of uncertain significance; not the typical 'strep throat' germ.  Pt denies any symptoms at this time. Verbalized understanding. Translator used for call.

## 2018-05-08 ENCOUNTER — Other Ambulatory Visit: Payer: Self-pay

## 2018-05-08 ENCOUNTER — Encounter (HOSPITAL_COMMUNITY): Payer: Self-pay | Admitting: Emergency Medicine

## 2018-05-08 ENCOUNTER — Ambulatory Visit (HOSPITAL_COMMUNITY)
Admission: EM | Admit: 2018-05-08 | Discharge: 2018-05-08 | Disposition: A | Discharge status: Home / Self Care | Payer: BLUE CROSS/BLUE SHIELD | Attending: Emergency Medicine | Admitting: Emergency Medicine

## 2018-05-08 DIAGNOSIS — R220 Localized swelling, mass and lump, head: Secondary | ICD-10-CM | POA: Diagnosis not present

## 2018-05-08 DIAGNOSIS — T7840XA Allergy, unspecified, initial encounter: Secondary | ICD-10-CM

## 2018-05-08 DIAGNOSIS — Z888 Allergy status to other drugs, medicaments and biological substances status: Secondary | ICD-10-CM

## 2018-05-08 DIAGNOSIS — L299 Pruritus, unspecified: Secondary | ICD-10-CM | POA: Diagnosis not present

## 2018-05-08 MED ORDER — METHYLPREDNISOLONE SODIUM SUCC 125 MG IJ SOLR
125.0000 mg | Freq: Once | INTRAMUSCULAR | Status: AC
Start: 2018-05-08 — End: 2018-05-08
  Administered 2018-05-08: 125 mg via INTRAMUSCULAR

## 2018-05-08 MED ORDER — DIPHENHYDRAMINE HCL 25 MG PO CAPS
ORAL_CAPSULE | ORAL | Status: AC
  Filled 2018-05-08: qty 1

## 2018-05-08 MED ORDER — FAMOTIDINE 20 MG PO TABS
40.0000 mg | ORAL_TABLET | Freq: Once | ORAL | Status: AC
  Administered 2018-05-08: 40 mg via ORAL

## 2018-05-08 MED ORDER — LORATADINE 10 MG PO TABS: 10.0000 mg | ORAL_TABLET | Freq: Every day | ORAL | 0 refills | Status: AC

## 2018-05-08 MED ORDER — FAMOTIDINE 20 MG PO TABS: 20.0000 mg | ORAL_TABLET | Freq: Two times a day (BID) | ORAL | 0 refills | Status: AC

## 2018-05-08 MED ORDER — METHYLPREDNISOLONE SODIUM SUCC 125 MG IJ SOLR
INTRAMUSCULAR | Status: AC
  Filled 2018-05-08: qty 2

## 2018-05-08 MED ORDER — METHYLPREDNISOLONE 4 MG PO TBPK: ORAL_TABLET | ORAL | 0 refills | Status: AC

## 2018-05-08 MED ORDER — FAMOTIDINE 20 MG PO TABS
ORAL_TABLET | ORAL | Status: AC
  Filled 2018-05-08: qty 1

## 2018-05-08 MED ORDER — DIPHENHYDRAMINE HCL 25 MG PO CAPS
50.0000 mg | ORAL_CAPSULE | Freq: Once | ORAL | Status: AC
  Administered 2018-05-08: 50 mg via ORAL

## 2018-05-08 MED ORDER — EPINEPHRINE 0.3 MG/0.3ML IJ SOAJ: 0.3000 mg | Freq: Once | INTRAMUSCULAR | 1 refills | Status: AC

## 2018-05-08 NOTE — Discharge Instructions (Addendum)
Finish the Medrol Dosepak, Pepcid and Claritin.  If your symptoms return, use the EpiPen, take 50 mg of Benadryl, and go immediately to the ER.  Make sure you have the pharmacist show you how to use the EpiPen.  Make sure that your healthcare providers know that you are allergic to Tylenol.

## 2018-05-08 NOTE — ED Provider Notes (Signed)
HPI  SUBJECTIVE:  Tiffany Johns is a 37 y.o. female who presents with lip, tongue, throat swelling and itching starting several minutes after taking generic Tylenol 500 mg.  This occurred 2 hours prior to arrival.  She reports difficulty breathing, wheezing, shortness of breath.  She states that it felt as if her throat was swelling shut.  She reports voice changes and itching of the palms.  She reports difficulty swallowing because of the swelling her throat.  She states that the symptoms are slowly getting better on their own.  She denies any new foods, drinks, lipsticks, other new meds.  She takes Naprosyn for headaches, but states that she has been taking this for some time without any problem.  She denies chest pain, rash, flushing, abdominal pain, diarrhea, syncope although she reports feeling lightheaded.  No stridor.  No aggravating or alleviating factors.  She has not tried anything for this.  This is never happened before.  She has a past medical history of headaches.  No history of anaphylaxis, allergy, asthma, hypertension, diabetes.  LMP: Today.  Denies the possibility being pregnant.  WUJ:WJXBJ, Cheri Rous, MD.  all history obtained through [place] language line.  Past Medical History:  Diagnosis Date  . Cholelithiases   . Hypotension   . Pancreatitis     Past Surgical History:  Procedure Laterality Date  . CESAREAN SECTION  x2  . CHOLECYSTECTOMY N/A 03/07/2016   Procedure: LAPAROSCOPIC CHOLECYSTECTOMY WITH INTRAOPERATIVE CHOLANGIOGRAM;  Surgeon: Karie Soda, MD;  Location: [facility];  Service: General;  Laterality: N/A;  . ESOPHAGOGASTRODUODENOSCOPY  09/20/2011   Procedure: ESOPHAGOGASTRODUODENOSCOPY (EGD);  Surgeon: Beverley Fiedler, MD;  Location: [facility] ENDOSCOPY;  Service: Gastroenterology;  Laterality: N/A;    Family History  Problem Relation Age of Onset  . Anesthesia problems Neg Hx   . Hypotension Neg Hx   . Malignant hyperthermia Neg Hx   . Pseudochol deficiency Neg Hx      Social History   Tobacco Use  . Smoking status: Never Smoker  . Smokeless tobacco: Never Used  Substance Use Topics  . Alcohol use: No  . Drug use: No    No current facility-administered medications for this encounter.   Current Outpatient Medications:  .  naproxen (NAPROSYN) 500 MG tablet, Take 1 tablet (500 mg total) by mouth 2 (two) times daily., Disp: 30 tablet, Rfl: 0 .  EPINEPHrine 0.3 mg/0.3 mL IJ SOAJ injection, Inject 0.3 mLs (0.3 mg total) into the muscle once for 1 dose., Disp: 1 Device, Rfl: 1 .  famotidine (PEPCID) 20 MG tablet, Take 1 tablet (20 mg total) by mouth 2 (two) times daily., Disp: 10 tablet, Rfl: 0 .  fluticasone (FLONASE) 50 MCG/ACT nasal spray, Place 2 sprays into both nostrils daily., Disp: 1 g, Rfl: 0 .  loratadine (CLARITIN) 10 MG tablet, Take 1 tablet (10 mg total) by mouth daily., Disp: 10 tablet, Rfl: 0 .  methylPREDNISolone (MEDROL DOSEPAK) 4 MG TBPK tablet, follow package directions, Disp: 21 tablet, Rfl: 0  No Known Allergies   ROS  As noted in HPI.   Physical Exam  BP 107/72 (BP Location: Left Arm)   Pulse 86   Temp 97.8 F (36.6 C) (Oral)   Resp 18   LMP 05/08/2018   SpO2 100%   Constitutional: Well developed, well nourished, no acute distress Eyes:  EOMI, conjunctiva normal bilaterally HENT: Normocephalic, atraumatic,mucus membranes moist.  Airway patent.  No angioedema, uvular edema, tongue swelling. Respiratory: Normal inspiratory effort, lungs clear bilaterally  Cardiovascular: Normal rate, regular rhythm no murmurs, rubs, gallops GI: nondistended skin: No rash, no flushing, no urticaria over torso or extremities Musculoskeletal: no deformities Neurologic: Alert & oriented x 3, no focal neuro deficits Psychiatric: Speech and behavior appropriate   ED Course   Medications  methylPREDNISolone sodium succinate (SOLU-MEDROL) 125 mg/2 mL injection 125 mg (125 mg Intramuscular Given 05/08/18 1709)  famotidine (PEPCID)  tablet 40 mg (40 mg Oral Given 05/08/18 1708)  diphenhydrAMINE (BENADRYL) capsule 50 mg (50 mg Oral Given 05/08/18 1708)    No orders of the defined types were placed in this encounter.   No results found for this or any previous visit (from the past 24 hour(s)). No results found.  ED Clinical Impression  Allergic reaction to drug, initial encounter   ED Assessment/Plan  Patient with an allergic reaction to Tylenol, however does not appear to be anaphylaxis at this point in time.  Giving Solu-Medrol 125 mg IM, Benadryl 50 mg p.o., Pepcid 40 mg p.o., will observe for 30 to 45 minutes.  If getting better, will send home with an EpiPen, Medrol Dosepak, Pepcid and Claritin.  She will need to follow-up with her primary care physician, and go to the ER if her symptoms recur.  Spent 25 minutes with the patient obtaining H&P, medical decision making, discussing treatment plan using the language line.  Updated allergy in epic to Tylenol.  On reevaluation, patient states that she feels much better.  Airway still widely patent.  No wheezing.  Plan as above.   Meds ordered this encounter  Medications  . methylPREDNISolone sodium succinate (SOLU-MEDROL) 125 mg/2 mL injection 125 mg  . famotidine (PEPCID) tablet 40 mg  . diphenhydrAMINE (BENADRYL) capsule 50 mg  . famotidine (PEPCID) 20 MG tablet    Sig: Take 1 tablet (20 mg total) by mouth 2 (two) times daily.    Dispense:  10 tablet    Refill:  0  . loratadine (CLARITIN) 10 MG tablet    Sig: Take 1 tablet (10 mg total) by mouth daily.    Dispense:  10 tablet    Refill:  0  . EPINEPHrine 0.3 mg/0.3 mL IJ SOAJ injection    Sig: Inject 0.3 mLs (0.3 mg total) into the muscle once for 1 dose.    Dispense:  1 Device    Refill:  1  . methylPREDNISolone (MEDROL DOSEPAK) 4 MG TBPK tablet    Sig: follow package directions    Dispense:  21 tablet    Refill:  0    *This clinic note was created using Scientist, clinical (histocompatibility and immunogenetics). Therefore, there  may be occasional mistakes despite careful proofreading.   ?   Domenick Gong, MD 05/09/18 1251

## 2018-05-08 NOTE — ED Triage Notes (Signed)
Mouth and throat itching.  Patient had a headache and took acetaminophen and now has a itchy mouth and throat

## 2018-06-20 ENCOUNTER — Emergency Department (HOSPITAL_COMMUNITY)
Admission: EM | Admit: 2018-06-20 | Discharge: 2018-06-20 | Disposition: A | Discharge status: Home / Self Care | Payer: BLUE CROSS/BLUE SHIELD | Attending: Emergency Medicine | Admitting: Emergency Medicine

## 2018-06-20 ENCOUNTER — Emergency Department (HOSPITAL_COMMUNITY): Payer: BLUE CROSS/BLUE SHIELD

## 2018-06-20 DIAGNOSIS — J029 Acute pharyngitis, unspecified: Secondary | ICD-10-CM | POA: Insufficient documentation

## 2018-06-20 DIAGNOSIS — R05 Cough: Secondary | ICD-10-CM | POA: Diagnosis not present

## 2018-06-20 DIAGNOSIS — R0982 Postnasal drip: Secondary | ICD-10-CM | POA: Insufficient documentation

## 2018-06-20 DIAGNOSIS — Z79899 Other long term (current) drug therapy: Secondary | ICD-10-CM | POA: Diagnosis not present

## 2018-06-20 LAB — GROUP A STREP BY PCR: GROUP A STREP BY PCR: NOT DETECTED

## 2018-06-20 MED ORDER — FEXOFENADINE HCL 60 MG PO TABS: 60.0000 mg | ORAL_TABLET | Freq: Every day | ORAL | 1 refills | Status: AC

## 2018-06-20 NOTE — ED Notes (Addendum)
**  Electronic interpreter used** Patient reports sore throat and cough. She reports going to urgent care and was given some medicine which helped for a short time but returned intermittently. EDP at bedisde

## 2018-06-20 NOTE — ED Notes (Signed)
Patient transported to X-ray 

## 2018-06-20 NOTE — ED Provider Notes (Addendum)
[facility] EMERGENCY DEPARTMENT Provider Note   CSN: [id number] Arrival date & time: 06/20/18  0222     History   Chief Complaint No chief complaint on file.   HPI Tiffany Johns is a 37 y.o. female.  37 year old female with prior medical history as detailed below presents for evaluation of sore throat.  Patient also reports mild cough.  Symptoms been ongoing for at least the last 1 to 2 months.  She denies associated fever.  She denies other acute complaint including chest pain, shortness of breath, nausea, vomiting, or abdominal pain.  She reports that she did presents from an urgent care approximately 1 month ago for similar symptoms.  She was given an antiallergy medication which helped significantly.  She has run out and now presents requesting a refill. She is adamant that she is not pregnant - and declines pregnancy testing.  Patient's history was obtained through use of [employer].  The history is provided by the patient and medical records. A language interpreter was used.  Illness  This is a new problem. The current episode started more than 1 week ago. The problem occurs constantly. The problem has not changed since onset.Pertinent negatives include no chest pain, no abdominal pain, no headaches and no shortness of breath. Nothing aggravates the symptoms. Nothing relieves the symptoms.    Past Medical History:  Diagnosis Date  . Cholelithiases   . Hypotension   . Pancreatitis     Patient Active Problem List   Diagnosis Date Noted  . Acute pancreatitis 03/06/2016  . Gallstone Pancreatitis s/p lap cholecystectomy 03/07/2016 03/06/2016  . Gallstones 02/02/2016  . Headache 07/15/2013    Past Surgical History:  Procedure Laterality Date  . CESAREAN SECTION  x2  . CHOLECYSTECTOMY N/A 03/07/2016   Procedure: LAPAROSCOPIC CHOLECYSTECTOMY WITH INTRAOPERATIVE CHOLANGIOGRAM;  Surgeon: Karie Soda, MD;  Location: [facility]S;  Service:  General;  Laterality: N/A;  . ESOPHAGOGASTRODUODENOSCOPY  09/20/2011   Procedure: ESOPHAGOGASTRODUODENOSCOPY (EGD);  Surgeon: Beverley Fiedler, MD;  Location: [facility] ENDOSCOPY;  Service: Gastroenterology;  Laterality: N/A;     OB History   No obstetric history on file.      Home Medications    Prior to Admission medications   Medication Sig Start Date End Date Taking? Authorizing Provider  famotidine (PEPCID) 20 MG tablet Take 1 tablet (20 mg total) by mouth 2 (two) times daily. 05/08/18   Domenick Gong, MD  fluticasone (FLONASE) 50 MCG/ACT nasal spray Place 2 sprays into both nostrils daily. 02/17/18   Cathie Hoops, Amy V, PA-C  loratadine (CLARITIN) 10 MG tablet Take 1 tablet (10 mg total) by mouth daily. 05/08/18   Domenick Gong, MD  methylPREDNISolone (MEDROL DOSEPAK) 4 MG TBPK tablet follow package directions 05/08/18   Domenick Gong, MD  naproxen (NAPROSYN) 500 MG tablet Take 1 tablet (500 mg total) by mouth 2 (two) times daily. 02/17/18   Belinda Fisher, PA-C    Family History Family History  Problem Relation Age of Onset  . Anesthesia problems Neg Hx   . Hypotension Neg Hx   . Malignant hyperthermia Neg Hx   . Pseudochol deficiency Neg Hx     Social History Social History   Tobacco Use  . Smoking status: Never Smoker  . Smokeless tobacco: Never Used  Substance Use Topics  . Alcohol use: No  . Drug use: No     Allergies   Tylenol [acetaminophen]   Review of Systems Review of Systems  Respiratory: Negative for  shortness of breath.   Cardiovascular: Negative for chest pain.  Gastrointestinal: Negative for abdominal pain.  Neurological: Negative for headaches.  All other systems reviewed and are negative.    Physical Exam Updated Vital Signs BP 105/72   Pulse 78   Temp 98.3 F (36.8 C) (Oral)   Resp (!) 25   SpO2 97%   Physical Exam Vitals signs and nursing note reviewed.  Constitutional:      General: She is not in acute distress.    Appearance: She is  well-developed.  HENT:     Head: Normocephalic and atraumatic.     Mouth/Throat:     Comments: Mild postnasal drip Eyes:     Conjunctiva/sclera: Conjunctivae normal.     Pupils: Pupils are equal, round, and reactive to light.  Neck:     Musculoskeletal: Normal range of motion and neck supple.  Cardiovascular:     Rate and Rhythm: Normal rate and regular rhythm.     Heart sounds: Normal heart sounds.  Pulmonary:     Effort: Pulmonary effort is normal. No respiratory distress.     Breath sounds: Normal breath sounds.  Abdominal:     General: There is no distension.     Palpations: Abdomen is soft.     Tenderness: There is no abdominal tenderness.  Musculoskeletal: Normal range of motion.        General: No deformity.  Skin:    General: Skin is warm and dry.  Neurological:     Mental Status: She is alert and oriented to person, place, and time.      ED Treatments / Results  Labs (all labs ordered are listed, but only abnormal results are displayed) Labs Reviewed  GROUP A STREP BY PCR    EKG None  Radiology Dg Chest 2 View  Result Date: 06/20/2018 CLINICAL DATA:  Dry cough.  LEFT-sided neck pain for 2 weeks. EXAM: CHEST - 2 VIEW COMPARISON:  11/06/2014. FINDINGS: The heart size and mediastinal contours are within normal limits. Both lungs are clear. The visualized skeletal structures are unremarkable. IMPRESSION: No active cardiopulmonary disease.  No change from priors. Electronically Signed   By: Elsie StainJohn T Curnes M.D.   On: 06/20/2018 09:07    Procedures Procedures (including critical care time)  Medications Ordered in ED Medications - No data to display   Initial Impression / Assessment and Plan / ED Course  I have reviewed the triage vital signs and the nursing notes.  Pertinent labs & imaging results that were available during my care of the patient were reviewed by me and considered in my medical decision making (see chart for details).     MDM  Screen  complete  Patient is presenting for evaluation of sore throat.  Patient symptoms have been ongoing for at least 2 months.  History and exam suggest that her symptoms more likely allergic in nature.  Screening labs and x-ray performed today do not suggest more significant acute pathology.  Patient will be trialed with an anti-histamine at home.  She and her husband understand the need for close follow-up.  Strict return precautions given and understood.  Final Clinical Impressions(s) / ED Diagnoses   Final diagnoses:  Post-nasal drip    ED Discharge Orders         Ordered    fexofenadine (ALLEGRA) 60 MG tablet  Daily     06/20/18 0951           Wynetta FinesMessick, Peter C, MD 06/20/18 [phone]  Wynetta Fines, MD 06/20/18 [phone]

## 2018-06-20 NOTE — Discharge Instructions (Addendum)
Please return for any problem. Follow up with your regular physician as instructed.  °

## 2018-06-20 NOTE — ED Notes (Signed)
Patient verbalizes understanding of discharge instructions. Opportunity for questioning and answers were provided. Armband removed by staff, pt discharged from ED ambulatory with female companion.  

## 2019-05-26 IMAGING — CR DG CHEST 2V
2 series · 2 of 2 positions shown · non-contrast
Comparison: 11/06/2014.

CLINICAL DATA: Dry cough.  LEFT-sided neck pain for 2 weeks.

EXAM:
CHEST - 2 VIEW

[chest pa]
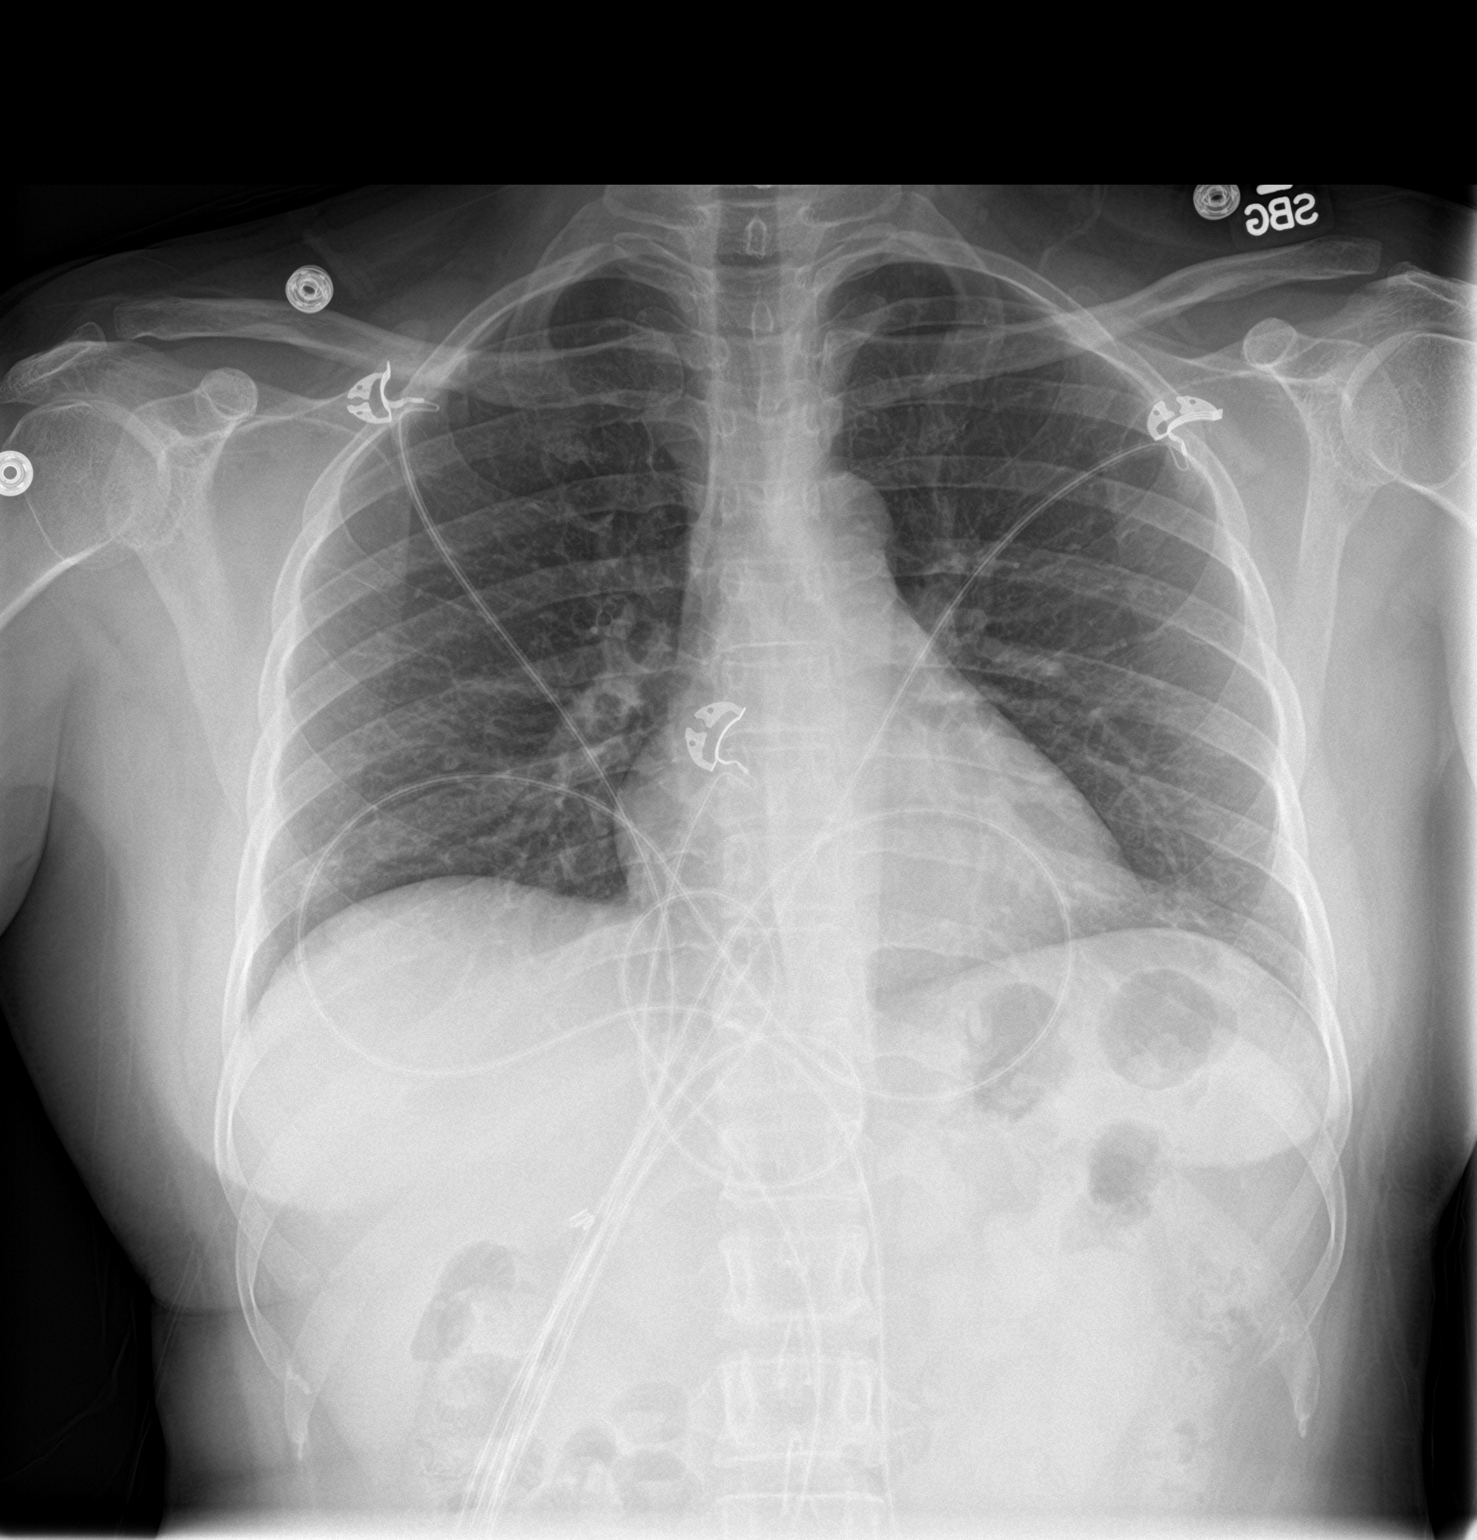

[chest lat]
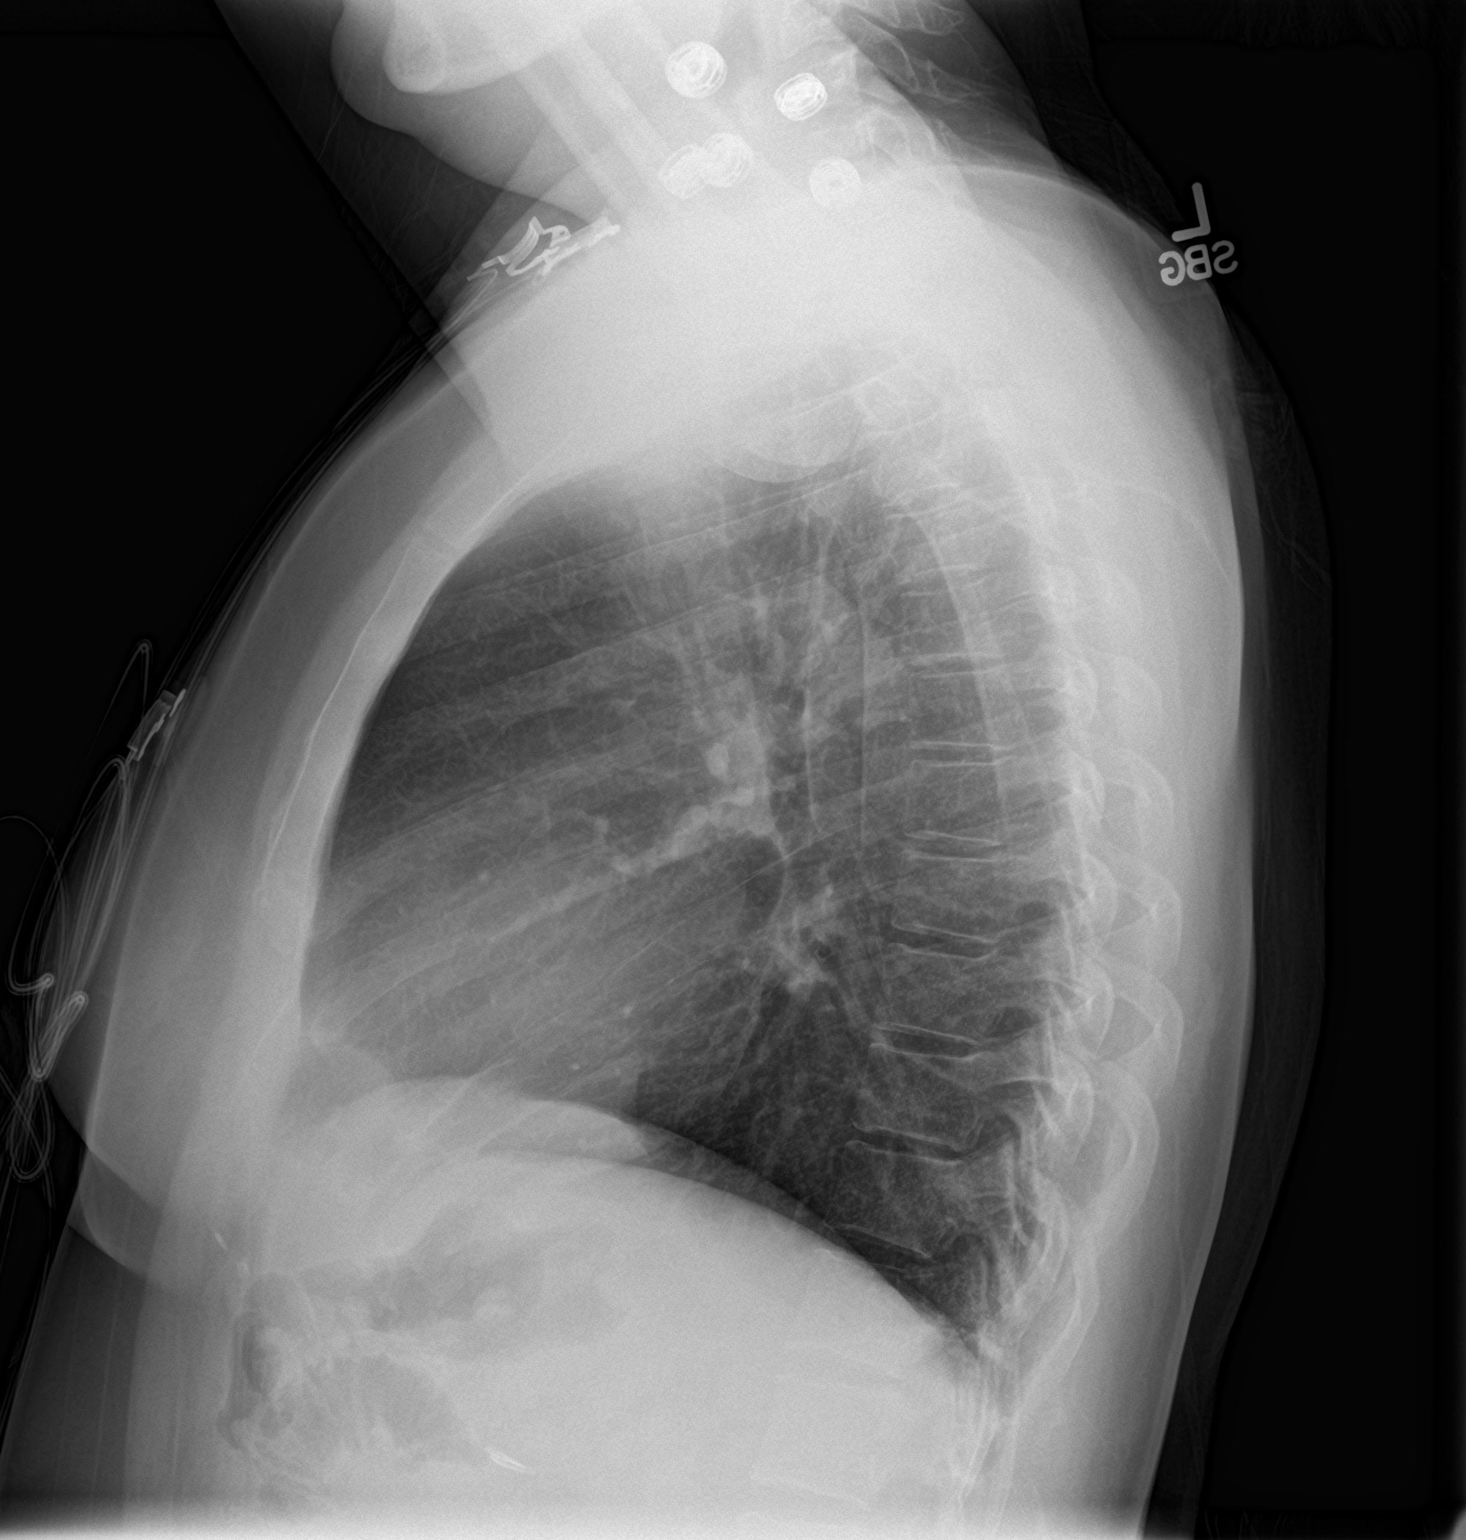

[2 of 2 positions shown; findings below may reference images not displayed]

FINDINGS: The heart size and mediastinal contours are within normal limits.
Both lungs are clear. The visualized skeletal structures are
unremarkable.
IMPRESSION: No active cardiopulmonary disease.  No change from priors.
